# Patient Record
Sex: Female | Born: 1960 | Race: White | Hispanic: No | Marital: Married | Smoking: Former smoker
Health system: Southern US, Community
[De-identification: ages and names within clinical notes are randomized; demographics above are authoritative.]

## PROBLEM LIST (undated history)

## (undated) DIAGNOSIS — J45909 Unspecified asthma, uncomplicated: Secondary | ICD-10-CM

## (undated) DIAGNOSIS — T7840XA Allergy, unspecified, initial encounter: Secondary | ICD-10-CM

## (undated) DIAGNOSIS — M1990 Unspecified osteoarthritis, unspecified site: Secondary | ICD-10-CM

## (undated) DIAGNOSIS — E079 Disorder of thyroid, unspecified: Secondary | ICD-10-CM

## (undated) DIAGNOSIS — R011 Cardiac murmur, unspecified: Secondary | ICD-10-CM

## (undated) DIAGNOSIS — R7303 Prediabetes: Secondary | ICD-10-CM

## (undated) HISTORY — PX: APPENDECTOMY: SHX54

## (undated) HISTORY — DX: Prediabetes: R73.03

## (undated) HISTORY — DX: Cardiac murmur, unspecified: R01.1

## (undated) HISTORY — DX: Unspecified asthma, uncomplicated: J45.909

## (undated) HISTORY — DX: Allergy, unspecified, initial encounter: T78.40XA

## (undated) HISTORY — DX: Unspecified osteoarthritis, unspecified site: M19.90

## (undated) HISTORY — DX: Disorder of thyroid, unspecified: E07.9

---

## 2005-03-20 ENCOUNTER — Emergency Department: Payer: Self-pay | Admitting: Emergency Medicine

## 2009-09-24 ENCOUNTER — Ambulatory Visit: Payer: Self-pay | Admitting: Surgery

## 2009-09-24 ENCOUNTER — Ambulatory Visit: Payer: Self-pay | Admitting: Family Medicine

## 2009-09-26 LAB — PATHOLOGY REPORT

## 2009-10-04 ENCOUNTER — Emergency Department: Payer: Self-pay | Admitting: Emergency Medicine

## 2011-09-30 IMAGING — CT CT ABD-PELV W/ CM
1 of 2 series · 15 of 32 positions shown, 19 images · non-contrast
Comparison: none

REASON FOR EXAM: STAT CR [id number] READ BEFORE PT LEAVES THEN CALL ABD PAIN
RLQ EVAL FOR APPEND...
COMMENTS:

PROCEDURE:     CT  - CT ABDOMEN / PELVIS  W  - September 24, 2009  [DATE]
RESULT:     CT of the abdomen pelvis dated 09/19/2009.
TECHNIQUE: Helical 3 mm sections were obtained from the lung bases through
the pubic symphysis status post intravenous administration of 100 mL of
Ssovue-BBC and oral contrast.

[Series 2: appendicitis · axial · 0.68mm/px · z∈[-546,-138]mm · 15 of 148 slices shown, 19 images]
[im 6/148  soft-tissue]
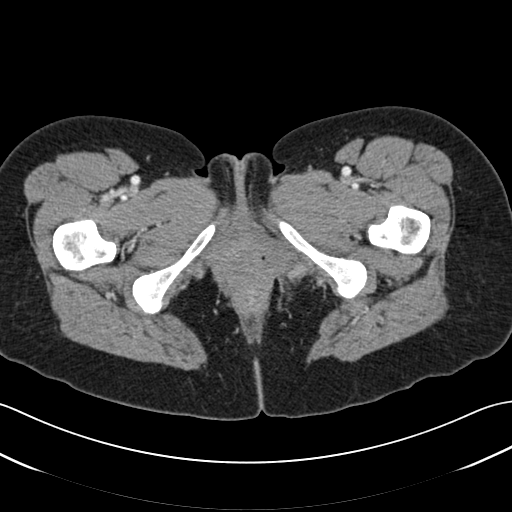
[im 6/148  bone]
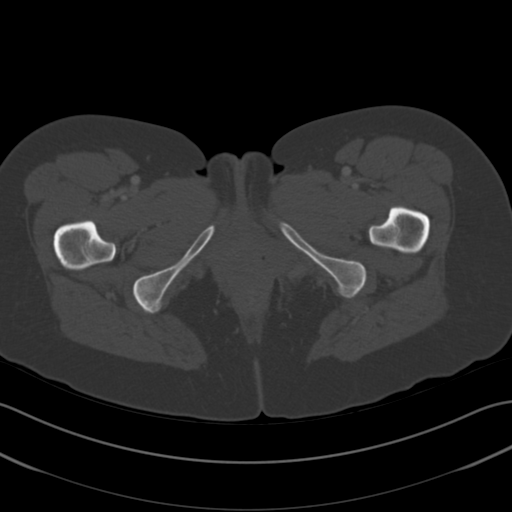
[im 18/148  soft-tissue]
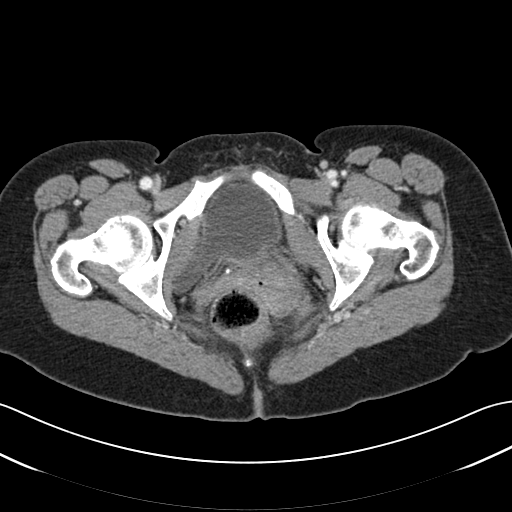
[im 30/148  soft-tissue]
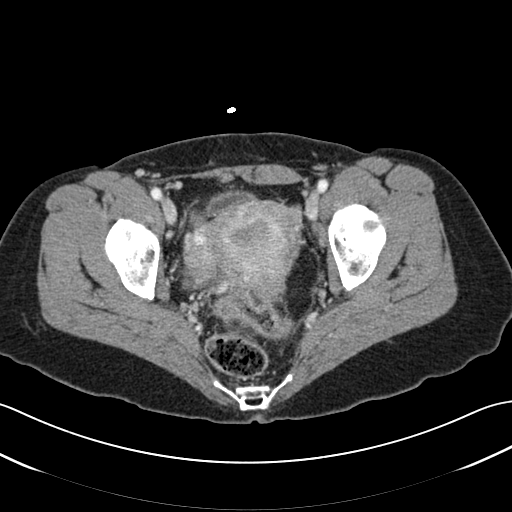
[im 42/148  soft-tissue]
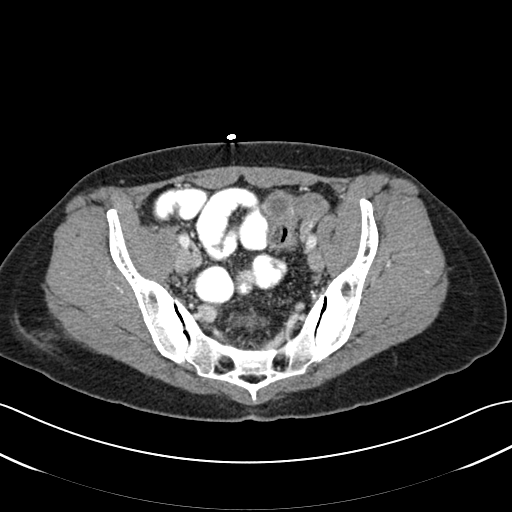
[im 53/148  soft-tissue]
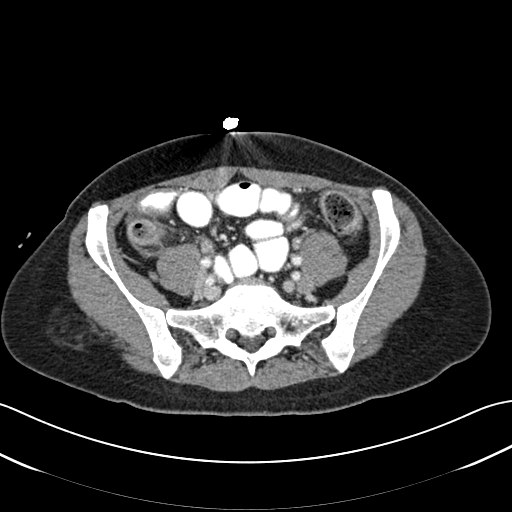
[im 65/148  soft-tissue]
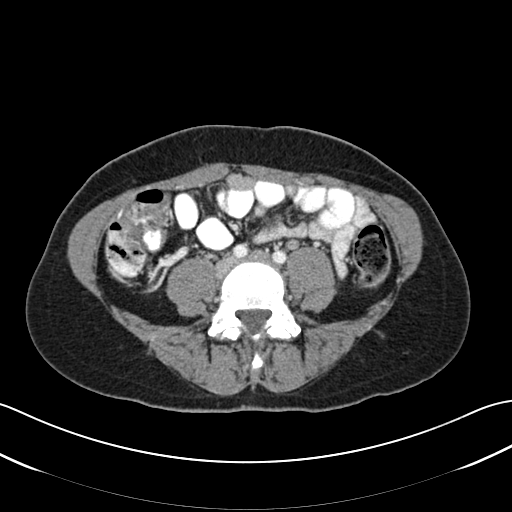
[im 77/148  soft-tissue]
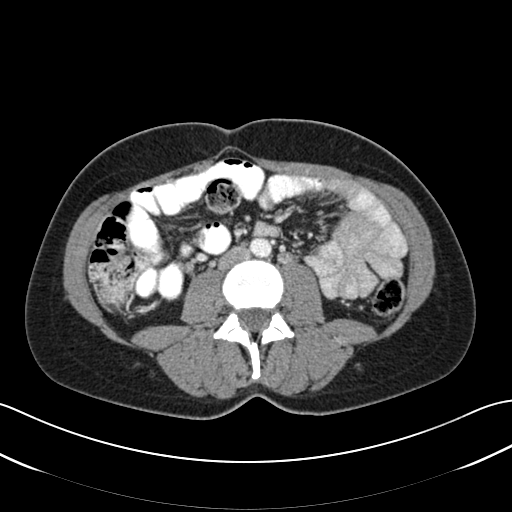
[im 83/148  soft-tissue]
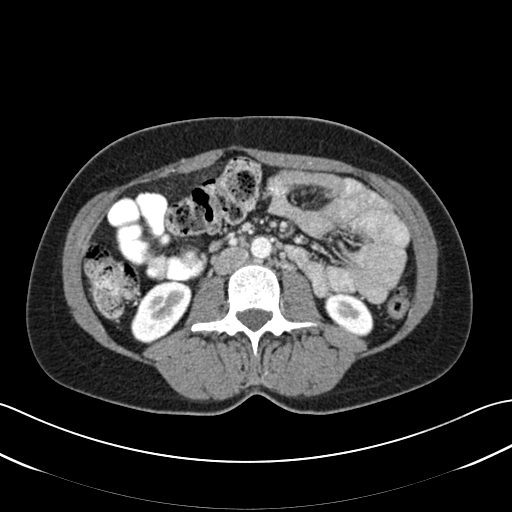
[im 95/148  soft-tissue]
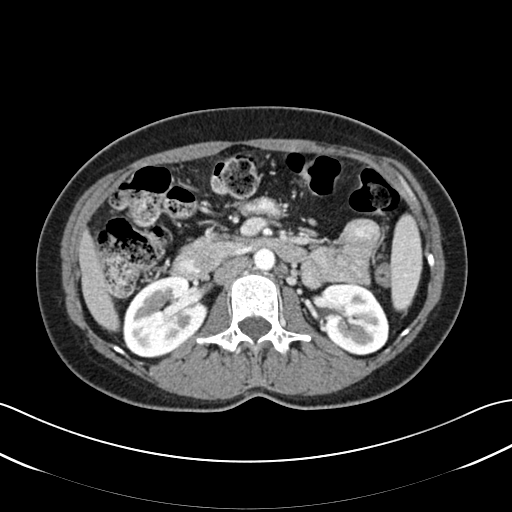
[im 95/148  bone]
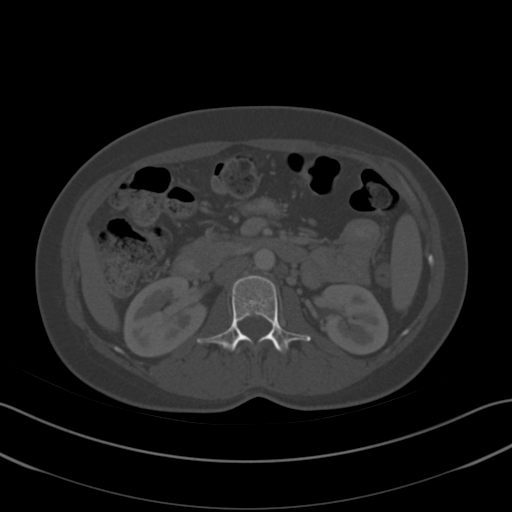
[im 106/148  soft-tissue]
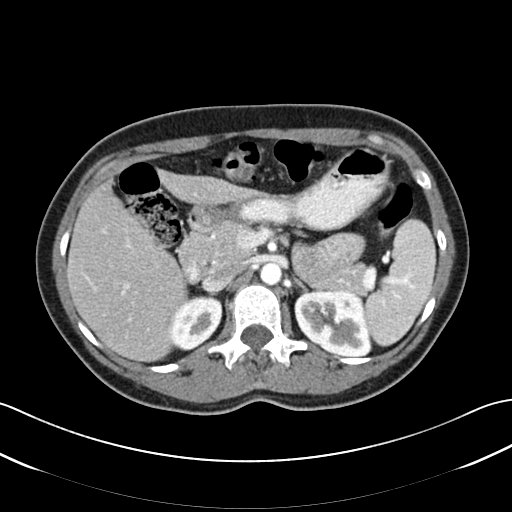
[im 118/148  soft-tissue]
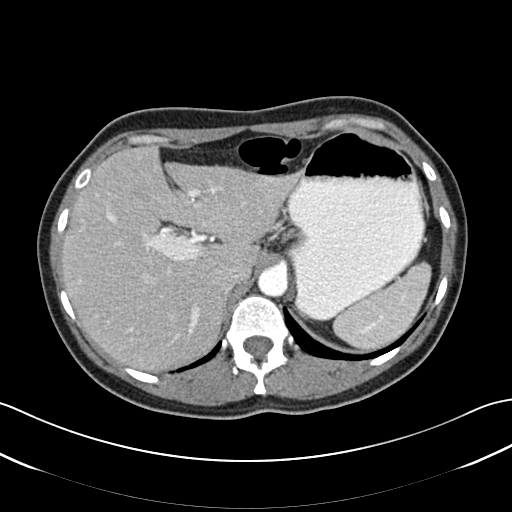
[im 124/148  lung]
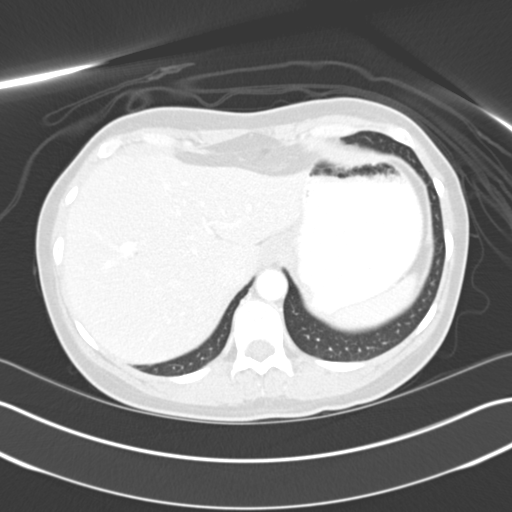
[im 130/148  soft-tissue]
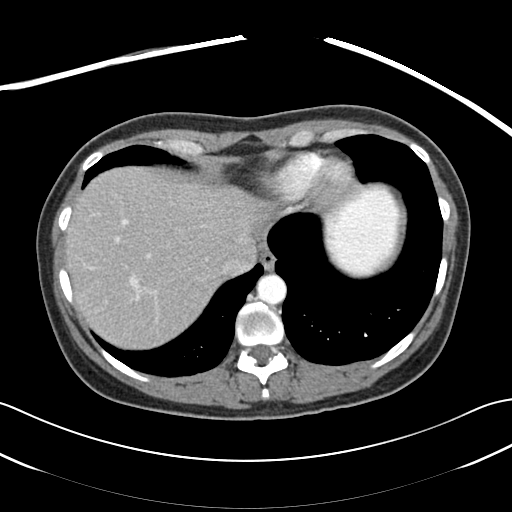
[im 130/148  lung]
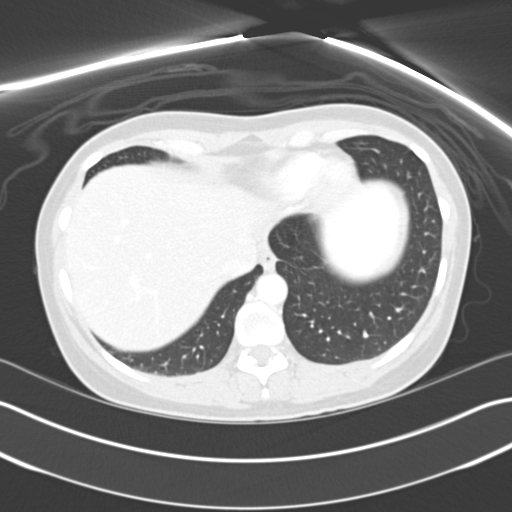
[im 136/148  lung]
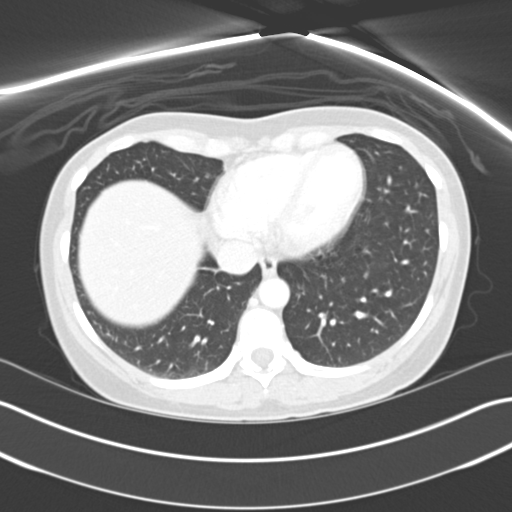
[im 142/148  soft-tissue]
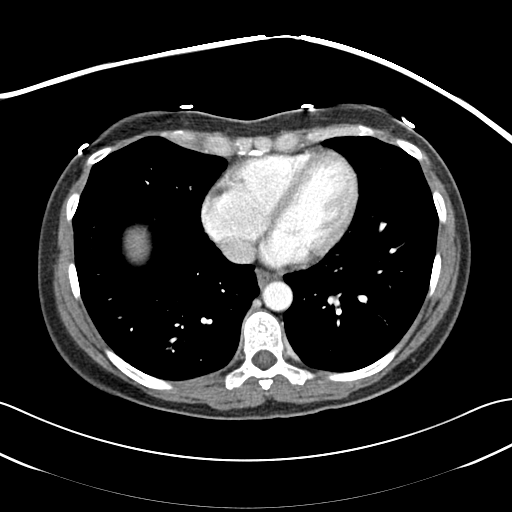
[im 142/148  lung]
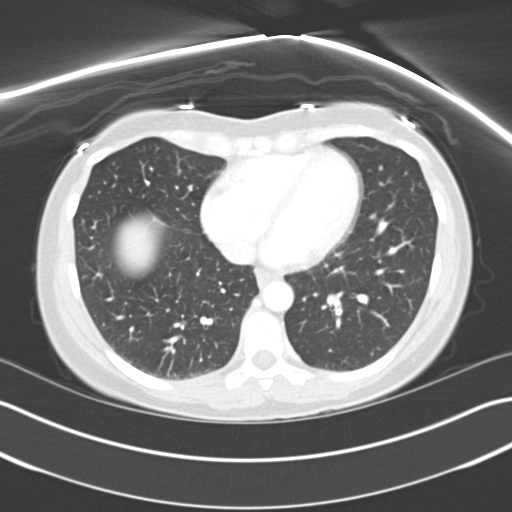

[15 of 32 positions shown; findings below may reference images not displayed]

FINDINGS: Evaluation of the lung bases demonstrates no gross abnormalities.

Evaluation of the right lower quadrant region demonstrates a dilated
appendix measuring 1.71 cm in diameter. The appendix is fluid and
air-filled. There is induration within the periappendiceal fat as well as a
small amount of surrounding free fluid. There is no evidence of drainable
loculated fluid collections. The appendix projects along the distal
posterior aspect of the cecum just anterior to the iliopsoas musculature.
Bowel wall thickening is identified within the distal portion of the cecum.
There is no evidence of associated free air. Small lymph nodes are
appreciated within the surrounding mesenteric fat.

The liver, spleen, adrenals, pancreas, kidneys are unremarkable. There is no
CT evidence of bowel obstruction. There no secondary signs suggesting
further regions of colitis, enteritis nor evidence of bowel obstruction.
There is no evidence of abdominal aortic aneurysm.

Evaluation of the pelvis demonstrates a focal low attenuating cystic
appearing region within the anterior adnexal region on the left measuring
2.68 cm in diameter like representing an ovarian cyst.
IMPRESSION: CT findings consistent with appendicitis as described
above. There is no evidence of an associated abscess. Etiologies such as
colitis with appendiceal extension, inflammatory versus infectious cannot be
completely excluded though are lower different consideration.

## 2011-11-08 DIAGNOSIS — G43909 Migraine, unspecified, not intractable, without status migrainosus: Secondary | ICD-10-CM | POA: Insufficient documentation

## 2013-06-15 ENCOUNTER — Ambulatory Visit: Payer: Self-pay | Admitting: Gastroenterology

## 2013-06-18 LAB — PATHOLOGY REPORT

## 2013-12-10 DIAGNOSIS — E039 Hypothyroidism, unspecified: Secondary | ICD-10-CM | POA: Insufficient documentation

## 2019-06-08 ENCOUNTER — Other Ambulatory Visit: Payer: Self-pay | Admitting: Family Medicine

## 2019-06-08 DIAGNOSIS — Z1231 Encounter for screening mammogram for malignant neoplasm of breast: Secondary | ICD-10-CM

## 2020-05-30 DIAGNOSIS — J309 Allergic rhinitis, unspecified: Secondary | ICD-10-CM | POA: Insufficient documentation

## 2020-07-28 ENCOUNTER — Encounter: Payer: Self-pay | Admitting: *Deleted

## 2020-07-28 ENCOUNTER — Emergency Department
Admission: EM | Admit: 2020-07-28 | Discharge: 2020-07-28 | Disposition: A | Discharge status: Home / Self Care | Payer: Self-pay | Attending: Emergency Medicine | Admitting: Emergency Medicine

## 2020-07-28 ENCOUNTER — Emergency Department: Payer: Self-pay

## 2020-07-28 ENCOUNTER — Other Ambulatory Visit: Payer: Self-pay

## 2020-07-28 DIAGNOSIS — M79674 Pain in right toe(s): Secondary | ICD-10-CM | POA: Insufficient documentation

## 2020-07-28 LAB — BASIC METABOLIC PANEL WITH GFR
Anion gap: 8 (ref 5–15)
BUN: 18 mg/dL (ref 6–20)
CO2: 27 mmol/L (ref 22–32)
Calcium: 9.2 mg/dL (ref 8.9–10.3)
Chloride: 105 mmol/L (ref 98–111)
Creatinine, Ser: 0.9 mg/dL (ref 0.44–1.00)
GFR, Estimated: >60 mL/min
Glucose, Bld: 150 mg/dL — ABNORMAL HIGH (ref 70–99)
Potassium: 3.6 mmol/L (ref 3.5–5.1)
Sodium: 140 mmol/L (ref 135–145)

## 2020-07-28 LAB — CBC WITH DIFFERENTIAL/PLATELET
Abs Immature Granulocytes: 0.01 K/uL (ref 0.00–0.07)
Basophils Absolute: 0 K/uL (ref 0.0–0.1)
Basophils Relative: 1 %
Eosinophils Absolute: 0.1 K/uL (ref 0.0–0.5)
Eosinophils Relative: 1 %
HCT: 37.4 % (ref 36.0–46.0)
Hemoglobin: 12.5 g/dL (ref 12.0–15.0)
Immature Granulocytes: 0 %
Lymphocytes Relative: 31 %
Lymphs Abs: 1.9 K/uL (ref 0.7–4.0)
MCH: 29.4 pg (ref 26.0–34.0)
MCHC: 33.4 g/dL (ref 30.0–36.0)
MCV: 88 fL (ref 80.0–100.0)
Monocytes Absolute: 0.5 K/uL (ref 0.1–1.0)
Monocytes Relative: 9 %
Neutro Abs: 3.6 K/uL (ref 1.7–7.7)
Neutrophils Relative %: 58 %
Platelets: 173 K/uL (ref 150–400)
RBC: 4.25 MIL/uL (ref 3.87–5.11)
RDW: 12.4 % (ref 11.5–15.5)
WBC: 6.1 K/uL (ref 4.0–10.5)
nRBC: 0 % (ref 0.0–0.2)

## 2020-07-28 LAB — URIC ACID: Uric Acid, Serum: 5.2 mg/dL (ref 2.5–7.1)

## 2020-07-28 MED ORDER — PREDNISONE 10 MG PO TABS: ORAL_TABLET | ORAL | 0 refills | Status: AC

## 2020-07-28 MED ORDER — PREDNISONE 20 MG PO TABS
60.0000 mg | ORAL_TABLET | Freq: Once | ORAL | Status: AC
  Administered 2020-07-28: 60 mg via ORAL
  Filled 2020-07-28: qty 3

## 2020-07-28 NOTE — ED Triage Notes (Signed)
Pt has swelling to right great toe.  Sx began last night.  No known injury to toe.  Pt was seen at urgent care but did not get meds filled.  Pt alert  Speech clear.

## 2020-07-28 NOTE — Discharge Instructions (Signed)
Please take steroid taper as directed on the packaging.  You may also use Tylenol, up to 1000 mg 4 times daily as needed for pain.  Please follow-up with orthopedics if your pain is not improving.

## 2020-07-29 NOTE — ED Provider Notes (Signed)
[facility] Emergency Department Provider Note  ____________________________________________   Event Date/Time   First MD Initiated Contact with Patient 07/28/20 2122     (approximate)  I have reviewed the triage vital signs and the nursing notes.   HISTORY  Chief Complaint Toe Pain   HPI Danielle Fuentes is a 60 y.o. female who presents to the emergency department for evaluation of right great toe swelling and pain.  She denies any known injury or history.  She denies any previous episodes of gout.  She states it began late last night/early this morning.  She was evaluated the urgent care and told to ice it and take NSAIDs, she did not do this because the pain was worsening and she was having difficulty ambulating.  She denies any recent sores, cuts to the area.  She denies any history of diabetes.       No past medical history on file.  There are no problems to display for this patient.    Prior to Admission medications   Medication Sig Start Date End Date Taking? Authorizing Provider  predniSONE (DELTASONE) 10 MG tablet Take 6 tablets (60 mg total) by mouth daily for 1 day, THEN 5 tablets (50 mg total) daily for 1 day, THEN 4 tablets (40 mg total) daily for 1 day, THEN 3 tablets (30 mg total) daily for 1 day, THEN 2 tablets (20 mg total) daily for 1 day, THEN 1 tablet (10 mg total) daily for 1 day. 07/28/20 08/03/20 Yes Lucy Chris, PA    Allergies Patient has no known allergies.  No family history on file.  Social History Social History   Tobacco Use  . Smoking status: Never Smoker  . Smokeless tobacco: Never Used  Substance Use Topics  . Alcohol use: Not Currently  . Drug use: Not Currently    Review of Systems Constitutional: No fever/chills Eyes: No visual changes. ENT: No sore throat. Cardiovascular: Denies chest pain. Respiratory: Denies shortness of breath. Gastrointestinal: No abdominal pain.  No nausea, no vomiting.  No  diarrhea.  No constipation. Genitourinary: Negative for dysuria. Musculoskeletal: + Right great toe pain, negative for back pain. Skin: Negative for rash. Neurological: Negative for headaches, focal weakness or numbness.   ____________________________________________   PHYSICAL EXAM:  VITAL SIGNS: ED Triage Vitals  Enc Vitals Group     BP 07/28/20 2108 (!) 145/79     Pulse Rate 07/28/20 2108 87     Resp 07/28/20 2108 18     Temp 07/28/20 2108 98.6 F (37 C)     Temp Source 07/28/20 2108 Oral     SpO2 07/28/20 2108 97 %     Weight 07/28/20 2105 138 lb (62.6 kg)     Height 07/28/20 2105 5\' 3"  (1.6 m)     Head Circumference --      Peak Flow --      Pain Score 07/28/20 2105 4     Pain Loc --      Pain Edu? --      Excl. in GC? --    Constitutional: Alert and oriented. Well appearing and in no acute distress. Eyes: Conjunctivae are normal. PERRL. EOMI. Head: Atraumatic. Nose: No congestion/rhinnorhea. Mouth/Throat: Mucous membranes are moist.  Oropharynx non-erythematous. Neck: No stridor.   Cardiovascular: Normal rate, regular rhythm. Grossly normal heart sounds.  Good peripheral circulation. Respiratory: Normal respiratory effort.  No retractions. Lungs CTAB. Musculoskeletal: There is soft tissue swelling noted around the first MTP of  the right foot, with mild erythema wrapping to the plantar surface of the first MTP.  Patient is able to wiggle all digits without significant difficulty.  Capillary refill less than 3 seconds.  No open wounds, cuts or sores.  No interdigital desquamation or fungi.  Dorsal pedal pulse 2+, ankle range of motion full. Neurologic:  Normal speech and language. No gross focal neurologic deficits are appreciated.  Skin:  Skin is warm, dry and intact. No rash noted. Psychiatric: Mood and affect are normal. Speech and behavior are normal.  ____________________________________________   LABS (all labs ordered are listed, but only abnormal results are  displayed)  Labs Reviewed  BASIC METABOLIC PANEL - Abnormal; Notable for the following components:      Result Value   Glucose, Bld 150 (*)    All other components within normal limits  CBC WITH DIFFERENTIAL/PLATELET  URIC ACID    ____________________________________________  RADIOLOGY Susa Raring, personally viewed and evaluated these images (plain radiographs) as part of my medical decision making, as well as reviewing the written report by the radiologist.  ED provider interpretation: Obvious degenerative changes of the first MTP without any significant acute findings  Official radiology report(s): DG Toe Great Right  Result Date: 07/28/2020 CLINICAL DATA:  First toe pain and swelling, no known injury, initial encounter EXAM: RIGHT GREAT TOE COMPARISON:  None. FINDINGS: Degenerative changes of the first MTP joint are seen. No acute fracture or dislocation is noted. Mild soft tissue swelling is noted. No other focal abnormality is seen. IMPRESSION: Degenerative change and soft tissue swelling. Electronically Signed   By: Alcide Clever M.D.   On: 07/28/2020 21:50   ____________________________________________   INITIAL IMPRESSION / ASSESSMENT AND PLAN / ED COURSE  As part of my medical decision making, I reviewed the following data within the electronic MEDICAL RECORD NUMBER Nursing notes reviewed and incorporated, Labs reviewed, Radiograph reviewed and Notes from prior ED visits        Patient is a 60 year old female without any significant past medical history who reports to the emergency department for evaluation of right great toe pain.  See HPI for further details.  In triage, patient is mildly hypertensive with otherwise normal vital signs.  Physical exam as above.  Differentials considered include first MTP sprain, gout, osteoarthritis, fracture, cellulitis  X-ray was obtained and demonstrates a fair amount of arthritic changes.  Labs were obtained and there is no  leukocytosis, uric acid is normal at 5.2.  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  Return precautions were discussed for any worsening of the erythema, fever, worsening of the pain.  In the interim, will initiate course of steroid for her obvious swelling and inflammation to the joint.  Patient is amenable with plan, stable this time for outpatient management.      ____________________________________________   FINAL CLINICAL IMPRESSION(S) / ED DIAGNOSES  Final diagnoses:  Great toe pain, right     ED Discharge Orders         Ordered    predniSONE (DELTASONE) 10 MG tablet        07/28/20 2311          *Please note:  ALBERTINA LEISE was evaluated in Emergency Department on 07/29/2020 for the symptoms described in the history of present illness. She was evaluated in the context of the global COVID-19 pandemic, which necessitated consideration that the patient  might be at risk for infection with the SARS-CoV-2 virus that causes COVID-19. Institutional protocols and algorithms that pertain to the evaluation of patients at risk for COVID-19 are in a state of rapid change based on information released by regulatory bodies including the CDC and federal and state organizations. These policies and algorithms were followed during the patient's care in the ED.  Some ED evaluations and interventions may be delayed as a result of limited staffing during and the pandemic.*   Note:  This document was prepared using Dragon voice recognition software and may include unintentional dictation errors.   Lucy Chris, PA 07/29/20 0014    Merwyn Katos, MD 07/29/20 [phone]

## 2020-09-20 ENCOUNTER — Emergency Department: Payer: Self-pay

## 2020-09-20 ENCOUNTER — Emergency Department
Admission: EM | Admit: 2020-09-20 | Discharge: 2020-09-20 | Disposition: A | Discharge status: Home / Self Care | Payer: Self-pay | Attending: Emergency Medicine | Admitting: Emergency Medicine

## 2020-09-20 ENCOUNTER — Other Ambulatory Visit: Payer: Self-pay

## 2020-09-20 DIAGNOSIS — K5901 Slow transit constipation: Secondary | ICD-10-CM | POA: Insufficient documentation

## 2020-09-20 LAB — CBC WITH DIFFERENTIAL/PLATELET
Abs Immature Granulocytes: 0.03 10*3/uL (ref 0.00–0.07)
Basophils Absolute: 0 10*3/uL (ref 0.0–0.1)
Basophils Relative: 0 %
Eosinophils Absolute: 0.1 10*3/uL (ref 0.0–0.5)
Eosinophils Relative: 2 %
HCT: 43.9 % (ref 36.0–46.0)
Hemoglobin: 14.5 g/dL (ref 12.0–15.0)
Immature Granulocytes: 1 %
Lymphocytes Relative: 41 %
Lymphs Abs: 2 10*3/uL (ref 0.7–4.0)
MCH: 29.4 pg (ref 26.0–34.0)
MCHC: 33 g/dL (ref 30.0–36.0)
MCV: 88.9 fL (ref 80.0–100.0)
Monocytes Absolute: 0.5 10*3/uL (ref 0.1–1.0)
Monocytes Relative: 10 %
Neutro Abs: 2.2 10*3/uL (ref 1.7–7.7)
Neutrophils Relative %: 46 %
Platelets: 191 10*3/uL (ref 150–400)
RBC: 4.94 MIL/uL (ref 3.87–5.11)
RDW: 12.2 % (ref 11.5–15.5)
WBC: 4.7 10*3/uL (ref 4.0–10.5)
nRBC: 0 % (ref 0.0–0.2)

## 2020-09-20 LAB — COMPREHENSIVE METABOLIC PANEL
ALT: 42 U/L (ref 0–44)
AST: 34 U/L (ref 15–41)
Albumin: 4.7 g/dL (ref 3.5–5.0)
Alkaline Phosphatase: 54 U/L (ref 38–126)
Anion gap: 7 (ref 5–15)
BUN: 15 mg/dL (ref 6–20)
CO2: 28 mmol/L (ref 22–32)
Calcium: 9.6 mg/dL (ref 8.9–10.3)
Chloride: 101 mmol/L (ref 98–111)
Creatinine, Ser: 0.79 mg/dL (ref 0.44–1.00)
GFR, Estimated: >60 mL/min (ref >60)
Glucose, Bld: 93 mg/dL (ref 70–99)
Potassium: 3.9 mmol/L (ref 3.5–5.1)
Sodium: 136 mmol/L (ref 135–145)
Total Bilirubin: 0.8 mg/dL (ref 0.3–1.2)
Total Protein: 8 g/dL (ref 6.5–8.1)

## 2020-09-20 MED ORDER — IOHEXOL 300 MG/ML  SOLN
75.0000 mL | Freq: Once | INTRAMUSCULAR | Status: AC | PRN
  Administered 2020-09-20: 75 mL via INTRAVENOUS
  Filled 2020-09-20: qty 75

## 2020-09-20 NOTE — ED Triage Notes (Signed)
Pt to ED POV for constipation. States has only had 2 BM in the past 2 weeks.  Has taken mag citrate

## 2020-09-20 NOTE — Discharge Instructions (Addendum)
Please use the mag citrate that was previously suggested to you, up to 200 mL daily.  You may alternatively use something similar to MiraLAX, which is 1 capful daily.  Follow-up with your primary care if you do not see improvement in your symptoms, otherwise follow-up with the ER if you experience any worsening of symptoms.

## 2020-09-20 NOTE — ED Notes (Signed)
Pt A&Ox4 ambulatory at d/c with independent steady gait, NAD. Pt verbalized understanding of d/c instructions and follow up care. °

## 2020-09-21 NOTE — ED Provider Notes (Signed)
[facility] Emergency Department Provider Note  ____________________________________________   Event Date/Time   First MD Initiated Contact with Patient 09/20/20 1855     (approximate)  I have reviewed the triage vital signs and the nursing notes.   HISTORY  Chief Complaint Constipation   HPI Danielle Fuentes is a 60 y.o. female who reports to the emergency department for evaluation of constipation.  Patient states that she has only had 2 very small bowel movements in the last 2 weeks.  She has tried laxatives with no improvement.  She was seen at an urgent care yesterday where x-rays were obtained.  They encouraged her to use 1 more dose of mag citrate to try and reduce a bowel movement if she did not have any success instructed her to return to the emergency department.  She now associates her constipation with generalized abdominal pain with nausea but denies any vomiting.  Denies any recent fever.         No past medical history on file.  There are no problems to display for this patient.    Prior to Admission medications   Not on File    Allergies Patient has no known allergies.  No family history on file.  Social History Social History   Tobacco Use   Smoking status: Never   Smokeless tobacco: Never  Substance Use Topics   Alcohol use: Not Currently   Drug use: Not Currently    Review of Systems Constitutional: No fever/chills Eyes: No visual changes. ENT: No sore throat. Cardiovascular: Denies chest pain. Respiratory: Denies shortness of breath. Gastrointestinal: + abdominal pain.  + nausea, no vomiting.  No diarrhea.  + constipation. Genitourinary: Negative for dysuria. Musculoskeletal: Negative for back pain. Skin: Negative for rash. Neurological: Negative for headaches, focal weakness or numbness.   ____________________________________________   PHYSICAL EXAM:  VITAL SIGNS: ED Triage Vitals  Enc Vitals Group     BP  09/20/20 1738 138/68     Pulse Rate 09/20/20 1738 75     Resp 09/20/20 1738 18     Temp 09/20/20 1738 98.9 F (37.2 C)     Temp Source 09/20/20 1738 Oral     SpO2 09/20/20 1738 100 %     Weight 09/20/20 1739 127 lb 13.9 oz (58 kg)     Height 09/20/20 1739 5\' 3"  (1.6 m)     Head Circumference --      Peak Flow --      Pain Score 09/20/20 1738 0     Pain Loc --      Pain Edu? --      Excl. in GC? --    Constitutional: Alert and oriented. Well appearing and in no acute distress. Eyes: Conjunctivae are normal. PERRL. EOMI. Head: Atraumatic. Nose: No congestion/rhinnorhea. Mouth/Throat: Mucous membranes are moist.  Oropharynx non-erythematous. Neck: No stridor.   Cardiovascular: Normal rate, regular rhythm. Grossly normal heart sounds.  Good peripheral circulation. Respiratory: Normal respiratory effort.  No retractions. Lungs CTAB. Gastrointestinal: Soft with generalized tenderness, particularly in the bilateral lower quadrants. No distention. No abdominal bruits. No CVA tenderness. Musculoskeletal: No lower extremity tenderness nor edema.  No joint effusions. Neurologic:  Normal speech and language. No gross focal neurologic deficits are appreciated. No gait instability. Skin:  Skin is warm, dry and intact. No rash noted. Psychiatric: Mood and affect are normal. Speech and behavior are normal.  ____________________________________________   LABS (all labs ordered are listed, but only abnormal results are  displayed)  Labs Reviewed  CBC WITH DIFFERENTIAL/PLATELET  COMPREHENSIVE METABOLIC PANEL    ____________________________________________  RADIOLOGY  Official radiology report(s): CT ABDOMEN PELVIS W CONTRAST  Result Date: 09/20/2020 CLINICAL DATA:  Bowel obstruction suspected Constipation. EXAM: CT ABDOMEN AND PELVIS WITH CONTRAST TECHNIQUE: Multidetector CT imaging of the abdomen and pelvis was performed using the standard protocol following bolus administration of  intravenous contrast. CONTRAST:  51mL OMNIPAQUE IOHEXOL 300 MG/ML  SOLN COMPARISON:  Remote abdominal CT 09/24/2009 FINDINGS: Lower chest: No acute airspace disease or pleural effusion. Heart is normal in size. Hepatobiliary: No focal liver abnormality is seen. No gallstones, gallbladder wall thickening, or biliary dilatation. Pancreas: No ductal dilatation or inflammation. Spleen: Normal in size without focal abnormality. Adrenals/Urinary Tract: Normal adrenal glands. No hydronephrosis or perinephric edema. Homogeneous renal enhancement with symmetric excretion on delayed phase imaging. No visualized renal stone or focal lesion. Urinary bladder is physiologically distended without wall thickening. Stomach/Bowel: Partially distended stomach. There is no small bowel obstruction or inflammation. Sutures at the base of the cecum suggestive of appendectomy. Moderate volume of stool throughout the colon. Mild diverticulosis of the distal descending and sigmoid colon. There is no colonic wall thickening or pericolonic inflammation. No evidence of pericolonic mass on this exam without enteric contrast. Vascular/Lymphatic: Normal caliber abdominal aorta. Patent portal vein and mesenteric vessels. No enlarged lymph nodes in the abdomen or pelvis. Reproductive: Uterus and bilateral adnexa are unremarkable. Other: No free air, free fluid, or intra-abdominal fluid collection. Tiny fat containing umbilical hernia. No inguinal hernia. Musculoskeletal: There are no acute or suspicious osseous abnormalities. IMPRESSION: 1. No bowel obstruction or acute abnormality in the abdomen/pelvis. 2. Moderate colonic stool burden, can be seen with constipation. No evidence of colonic stricture or mass. 3. Mild distal colonic diverticulosis without diverticulitis. Electronically Signed   By: Narda Rutherford M.D.   On: 09/20/2020 21:47      ____________________________________________   INITIAL IMPRESSION / ASSESSMENT AND PLAN / ED  COURSE  As part of my medical decision making, I reviewed the following data within the electronic MEDICAL RECORD NUMBER Nursing notes reviewed and incorporated, Labs reviewed, and Notes from prior ED visits        Patient is a 60 year old female who presents to the emergency department for evaluation of constipation that is been worsening, now with nausea present.  See HPI for further details.  In triage patient has normal vital signs.  Physical exam she has generalized tenderness in the abdomen, worse in the bilateral lower quadrants.  Otherwise grossly normal physical exam.  Review of the patient's chart from an outside urgent care reveals an x-ray that was obtained yesterday with findings concerning for ileus versus early SBO.  Given that the patient has had worsening of her symptoms no associated nausea, will obtain CT to rule out SBO.  Laboratory evaluation and CT is negative.  Discussed treatment with enema and the patient declines and does not want to do this.  Discussed other oral options including MiraLAX versus mag citrate versus Senokot in the advantages and disadvantages of each.  Patient would like to continue trialing these at home and follow-up with her primary care department and received enema today.  Patient is stable this time for outpatient follow-up.      ____________________________________________   FINAL CLINICAL IMPRESSION(S) / ED DIAGNOSES  Final diagnoses:  Slow transit constipation     ED Discharge Orders     None        Note:  This document  was prepared using Conservation officer, historic buildings and may include unintentional dictation errors.    Lucy Chris, PA 09/22/20 Glorianne Manchester, MD 09/23/20 1504

## 2022-02-18 DIAGNOSIS — R7303 Prediabetes: Secondary | ICD-10-CM | POA: Insufficient documentation

## 2022-09-26 IMAGING — CT CT ABD-PELV W/ CM
2 of 5 series · 15 of 46 positions shown, 17 images · IV contrast (APPLIED)
Comparison: Remote abdominal CT 09/24/2009

CLINICAL DATA: Bowel obstruction suspected

Constipation.
EXAM:
CT ABDOMEN AND PELVIS WITH CONTRAST
TECHNIQUE: Multidetector CT imaging of the abdomen and pelvis was performed
using the standard protocol following bolus administration of
intravenous contrast.
CONTRAST:  75mL OMNIPAQUE IOHEXOL 300 MG/ML  SOLN

[Series 2: routine abd/pel with · axial · 0.77mm/px · z∈[-654,-234]mm · 12 of 94 slices shown, 14 images]
[im 5/94  soft-tissue]
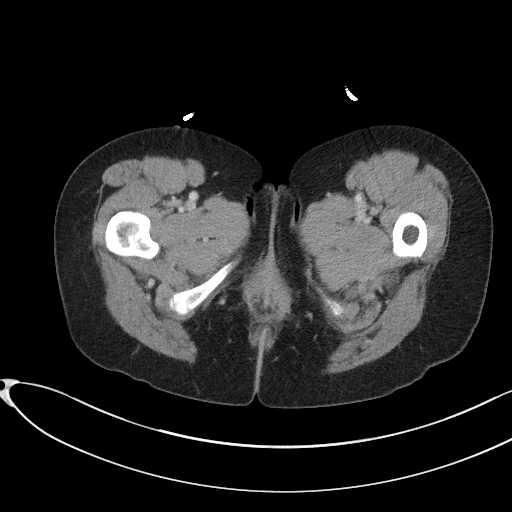
[im 5/94  bone]
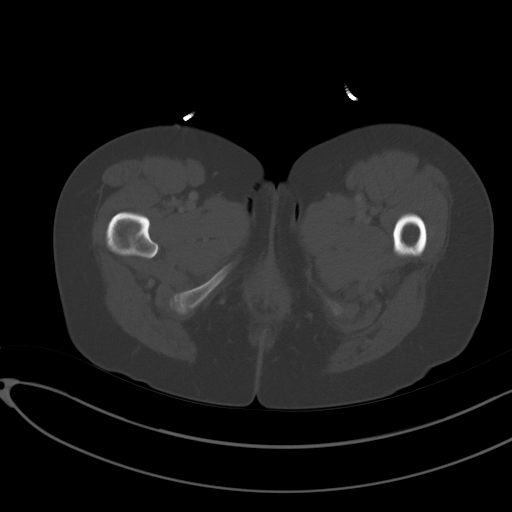
[im 15/94  soft-tissue]
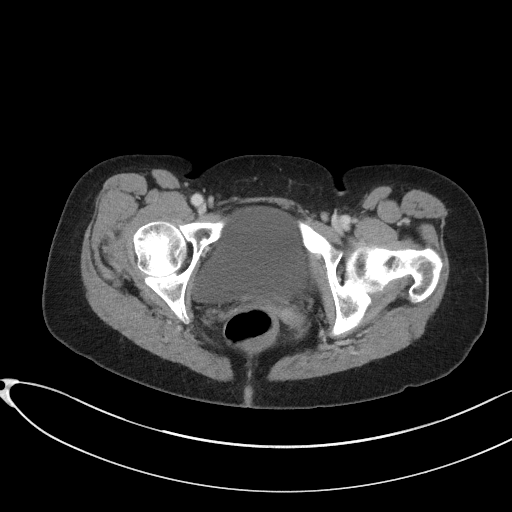
[im 20/94  soft-tissue]
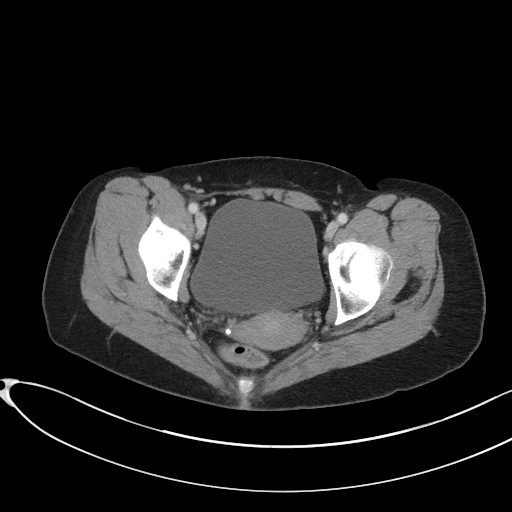
[im 30/94  soft-tissue]
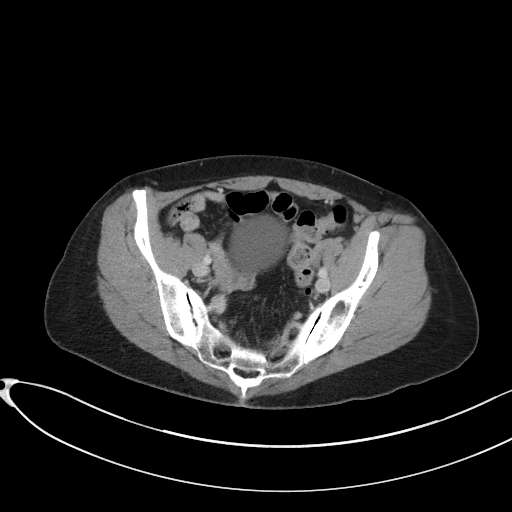
[im 35/94  soft-tissue]
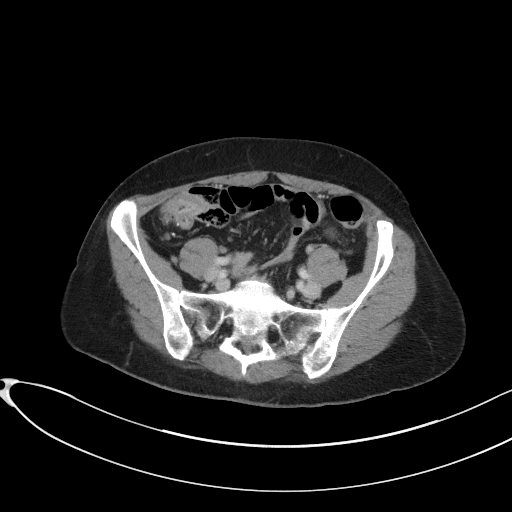
[im 45/94  soft-tissue]
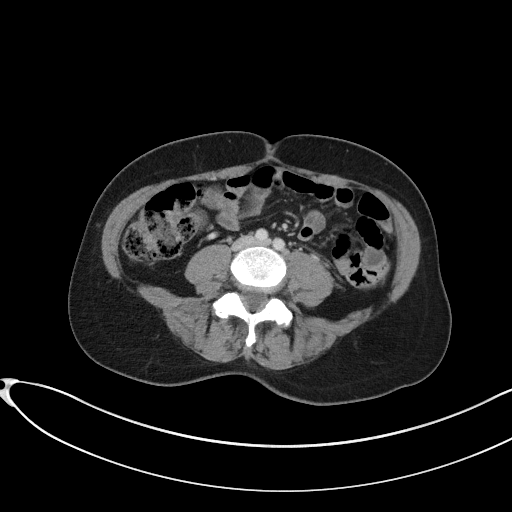
[im 49/94  soft-tissue]
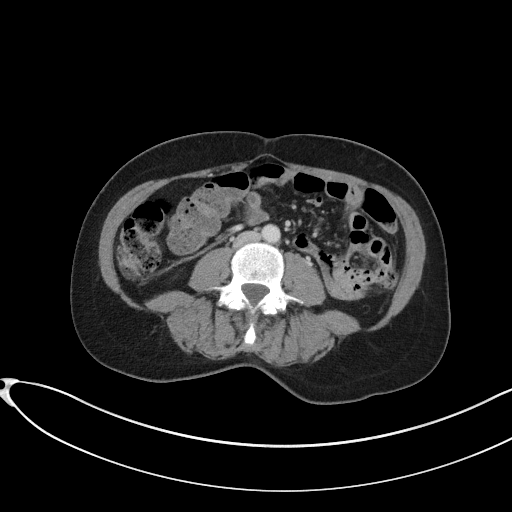
[im 59/94  soft-tissue]
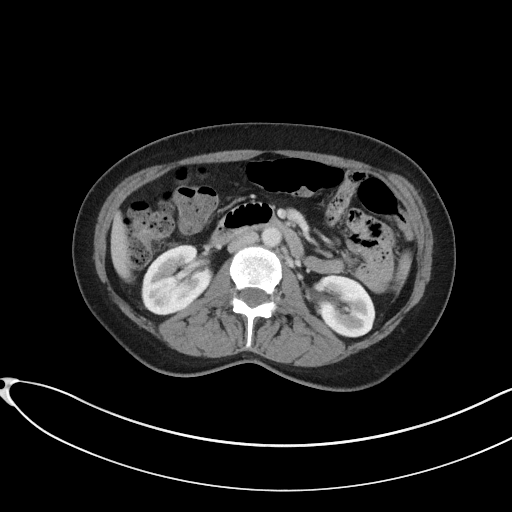
[im 64/94  soft-tissue]
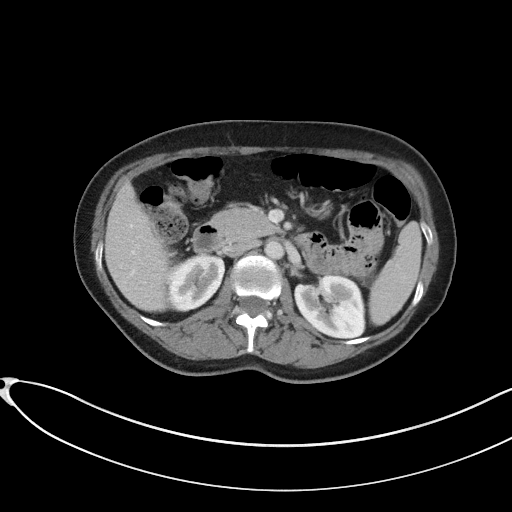
[im 64/94  bone]
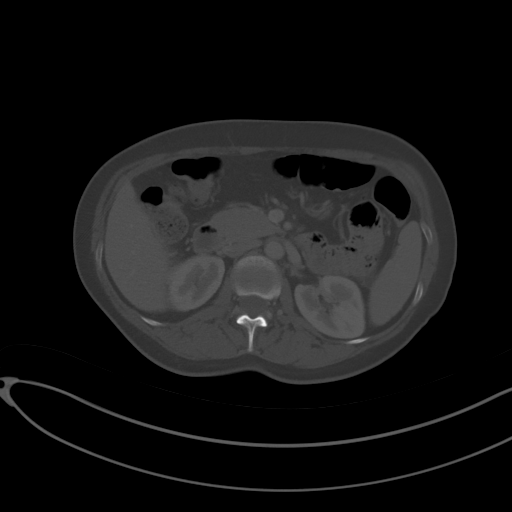
[im 74/94  soft-tissue]
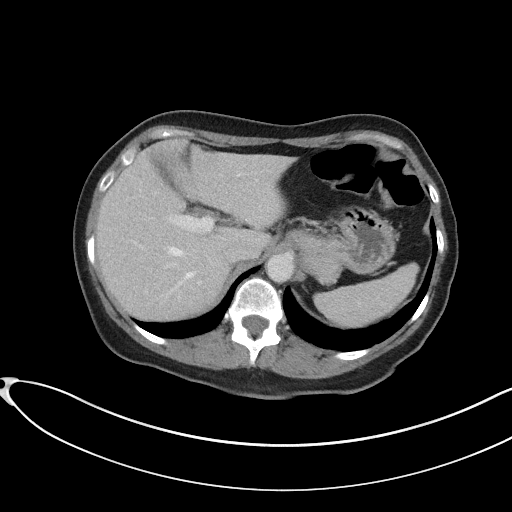
[im 79/94  soft-tissue]
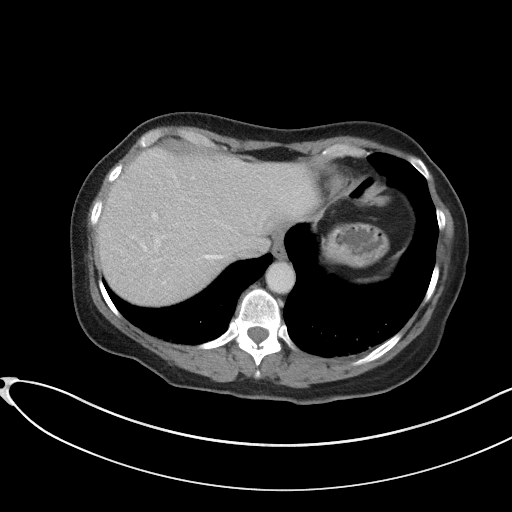
[im 89/94  soft-tissue]
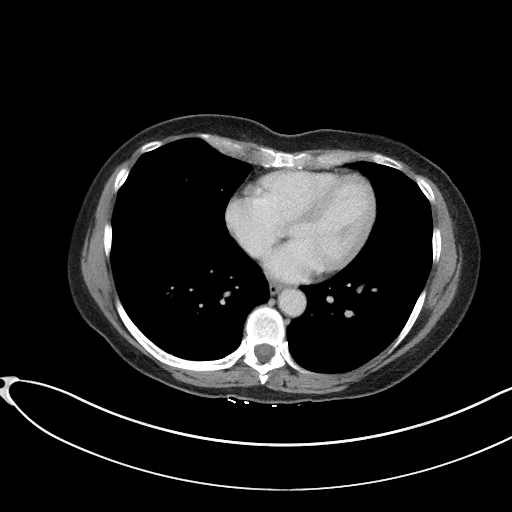

[Series 5: coronal st · coronal · 0.70mm/px · 3 of 82 slices shown]
[im 28/82  soft-tissue]
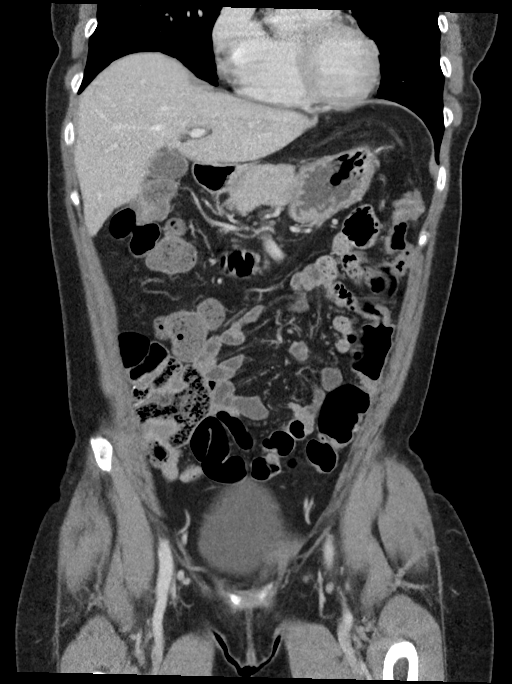
[im 37/82  soft-tissue]
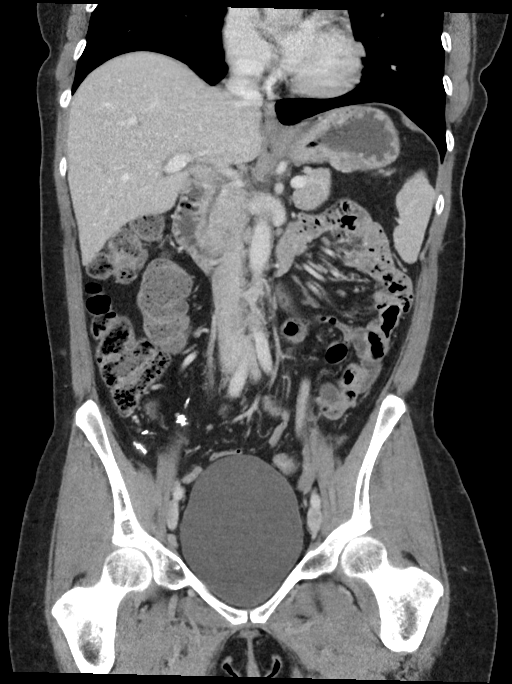
[im 46/82  soft-tissue]
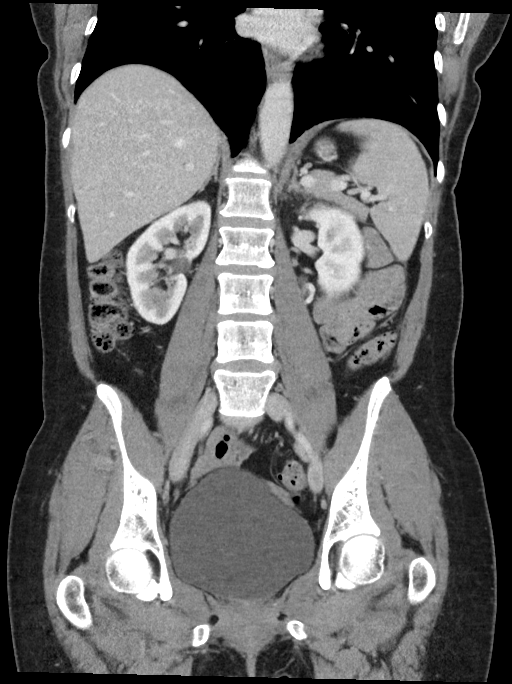

[15 of 46 positions shown; findings below may reference images not displayed]

FINDINGS: Lower chest: No acute airspace disease or pleural effusion. Heart is
normal in size.

Hepatobiliary: No focal liver abnormality is seen. No gallstones,
gallbladder wall thickening, or biliary dilatation.

Pancreas: No ductal dilatation or inflammation.

Spleen: Normal in size without focal abnormality.

Adrenals/Urinary Tract: Normal adrenal glands. No hydronephrosis or
perinephric edema. Homogeneous renal enhancement with symmetric
excretion on delayed phase imaging. No visualized renal stone or
focal lesion. Urinary bladder is physiologically distended without
wall thickening.

Stomach/Bowel: Partially distended stomach. There is no small bowel
obstruction or inflammation. Sutures at the base of the cecum
suggestive of appendectomy. Moderate volume of stool throughout the
colon. Mild diverticulosis of the distal descending and sigmoid
colon. There is no colonic wall thickening or pericolonic
inflammation. No evidence of pericolonic mass on this exam without
enteric contrast.

Vascular/Lymphatic: Normal caliber abdominal aorta. Patent portal
vein and mesenteric vessels. No enlarged lymph nodes in the abdomen
or pelvis.

Reproductive: Uterus and bilateral adnexa are unremarkable.

Other: No free air, free fluid, or intra-abdominal fluid collection.
Tiny fat containing umbilical hernia. No inguinal hernia.

Musculoskeletal: There are no acute or suspicious osseous
abnormalities.
IMPRESSION: 1. No bowel obstruction or acute abnormality in the abdomen/pelvis.
2. Moderate colonic stool burden, can be seen with constipation. No
evidence of colonic stricture or mass.
3. Mild distal colonic diverticulosis without diverticulitis.

## 2022-10-02 DIAGNOSIS — K5909 Other constipation: Secondary | ICD-10-CM | POA: Insufficient documentation

## 2022-10-02 DIAGNOSIS — K30 Functional dyspepsia: Secondary | ICD-10-CM | POA: Insufficient documentation

## 2022-10-02 NOTE — Patient Instructions (Addendum)
Migraine Headache A migraine headache is a very strong throbbing pain on one or both sides of your head. This type of headache can also cause other symptoms. It can last from 4 hours to 3 days. Talk with your doctor about what things may bring on (trigger) this condition. What are the causes? The exact cause of a migraine is not known. This condition may be brought on or caused by: Smoking. Medicines, such as: Medicine used to treat chest pain (nitroglycerin). Birth control pills. Estrogen. Some blood pressure medicines. Certain substances in some foods or drinks. Foods and drinks, such as: Cheese. Chocolate. Alcohol. Caffeine. Doing physical activity that is very hard. Other things that may trigger a migraine headache include: Periods. Pregnancy. Hunger. Stress. Getting too much or too little sleep. Weather changes. Feeling tired (fatigue). What increases the risk? Being 25-55 years old. Being female. Having a family history of migraine headaches. Being Caucasian. Having a mental health condition, such as being sad (depressed) or feeling worried or nervous (anxious). Being very overweight (obese). What are the signs or symptoms? A throbbing pain. This pain may: Happen in any area of the head, such as on one or both sides. Make it hard to do daily activities. Get worse with physical activity. Get worse around bright lights, loud noises, or smells. Other symptoms may include: Feeling like you may vomit (nauseous). Vomiting. Dizziness. Before a migraine headache starts, you may get warning signs (an aura). An aura may include: Seeing flashing lights or having blind spots. Seeing bright spots, halos, or zigzag lines. Having tunnel vision or blurred vision. Having numbness or a tingling feeling. Having trouble talking. Having weak muscles. After a migraine ends, you may have symptoms. These may include: Tiredness. Trouble thinking (concentrating). How is this  treated? Taking medicines that: Relieve pain. Relieve the feeling like you may vomit. Prevent migraine headaches. Treatment may also include: Acupuncture. Lifestyle changes like avoiding foods that bring on migraine headaches. Learning ways to control your body functions (biofeedback). Therapy to help you know and deal with negative thoughts (cognitive behavioral therapy). Follow these instructions at home: Medicines Take over-the-counter and prescription medicines only as told by your doctor. If told, take steps to prevent problems with pooping (constipation). You may need to: Drink enough fluid to keep your pee (urine) pale yellow. Take medicines. You will be told what medicines to take. Eat foods that are high in fiber. These include beans, whole grains, and fresh fruits and vegetables. Limit foods that are high in fat and sugar. These include fried or sweet foods. Ask your doctor if you should avoid driving or using machines while you are taking your medicine. Lifestyle  Do not drink alcohol. Do not smoke or use any products that contain nicotine or tobacco. If you need help quitting, ask your doctor. Get 7-9 hours of sleep each night, or the amount recommended by your doctor. Find ways to deal with stress, such as meditation, deep breathing, or yoga. Try to exercise often. This can help lessen how bad and how often your migraines happen. General instructions Keep a journal to find out what may bring on your migraine headaches. This can help you avoid those things. For example, write down: What you eat and drink. How much sleep you get. Any change to your medicines or diet. If you have a migraine headache: Avoid things that make your symptoms worse, such as bright lights. Lie down in a dark, quiet room. Do not drive or use machinery. Ask your   doctor what activities are safe for you. Where to find more information Coalition for Headache and Migraine Patients (CHAMP):  headachemigraine.org American Migraine Foundation: americanmigrainefoundation.org National Headache Foundation: headaches.org Contact a doctor if: You get a migraine headache that is different or worse than others you have had. You have more than 15 days of headaches in one month. Get help right away if: Your migraine headache gets very bad. Your migraine headache lasts more than 72 hours. You have a fever or stiff neck. You have trouble seeing. Your muscles feel weak or like you cannot control them. You lose your balance a lot. You have trouble walking. You faint. You have a seizure. This information is not intended to replace advice given to you by your health care provider. Make sure you discuss any questions you have with your health care provider. Document Revised: 11/02/2021 Document Reviewed: 11/02/2021 Elsevier Patient Education  2024 Elsevier Inc.  

## 2022-10-05 ENCOUNTER — Encounter: Payer: Self-pay | Admitting: Nurse Practitioner

## 2022-10-05 ENCOUNTER — Other Ambulatory Visit (HOSPITAL_COMMUNITY)
Admission: RE | Admit: 2022-10-05 | Discharge: 2022-10-05 | Disposition: A | Discharge status: Home / Self Care | Payer: Managed Care, Other (non HMO) | Source: Ambulatory Visit | Attending: Nurse Practitioner | Admitting: Nurse Practitioner

## 2022-10-05 ENCOUNTER — Ambulatory Visit (INDEPENDENT_AMBULATORY_CARE_PROVIDER_SITE_OTHER): Payer: Managed Care, Other (non HMO) | Admitting: Nurse Practitioner

## 2022-10-05 VITALS — BP 127/83 | HR 67 | Temp 98.3°F | Ht 62.01 in | Wt 132.4 lb

## 2022-10-05 DIAGNOSIS — Z124 Encounter for screening for malignant neoplasm of cervix: Secondary | ICD-10-CM | POA: Insufficient documentation

## 2022-10-05 DIAGNOSIS — E039 Hypothyroidism, unspecified: Secondary | ICD-10-CM

## 2022-10-05 DIAGNOSIS — Z1231 Encounter for screening mammogram for malignant neoplasm of breast: Secondary | ICD-10-CM

## 2022-10-05 DIAGNOSIS — Z7689 Persons encountering health services in other specified circumstances: Secondary | ICD-10-CM

## 2022-10-05 DIAGNOSIS — R7303 Prediabetes: Secondary | ICD-10-CM | POA: Diagnosis not present

## 2022-10-05 DIAGNOSIS — R102 Pelvic and perineal pain: Secondary | ICD-10-CM

## 2022-10-05 DIAGNOSIS — G43109 Migraine with aura, not intractable, without status migrainosus: Secondary | ICD-10-CM

## 2022-10-05 NOTE — Assessment & Plan Note (Signed)
?  mild prolapse present.  Obtained pap today as is past due and will order ultrasound to further assess.  If any abnormalities will plan on sending to GYN.  Reports he mother has a history of prolapse and had to have hysterectomy around same age.  Discussed plan with patient.

## 2022-10-05 NOTE — Progress Notes (Signed)
New Patient Office Visit  Subjective    Patient ID: Danielle Fuentes, female    DOB: 02-Feb-1961  Age: 62 y.o. MRN: [id number]  CC:  Chief Complaint  Patient presents with   New Patient (Initial Visit)    To establish care.  Patient states that during sex it hurts.    HPI Danielle Fuentes presents for new patient visit to establish care.  Introduced to Publishing rights manager role and practice setting.  All questions answered.  Discussed provider/patient relationship and expectations.  Has not had insurance for years -- was following with [facility].  HYPOTHYROIDISM Last TSH check March 2024 was stable.  Currently taking Levothyroxine 125 MCG daily. Thyroid control status:stable Satisfied with current treatment? yes Medication side effects: no Medication compliance: good compliance Etiology of hypothyroidism: unknown Recent dose adjustment:no Fatigue: yes -- currently over worked Cold intolerance: yes at baseline Heat intolerance: no Weight gain: no Weight loss: no Constipation: yes at baseline Diarrhea/loose stools: no Palpitations: no Lower extremity edema: no Anxiety/depressed mood: no   PREDIABETES Mom, dad, and brother all have diabetes. Last A1c 5.8% in March. Working on diet change, is doing strength exercises. Polydipsia/polyuria: no Visual disturbance: no Chest pain: no Paresthesias: no  MIGRAINES These are reducing -- believes was hormonal, last cycle about 10 years ago and migraines have reduced since then.  Currently one a month or every couple months. Duration: chronic Quality: dull, aching, and throbbing Frequency: intermittent Location: over right eye Headache duration: 3 days Radiation: no Time of day headache occurs: varies Alleviating factors: Maxalt Aggravating factors: stress and atmosphere changes Headache status at time of visit: asymptomatic Treatments attempted: rest and triptans   Aura: yes Nausea:  yes Vomiting: no Photophobia:   no Phonophobia:  no Effect on social functioning:  no Numbers of missed days of school/work each month: 0 Confusion:  no Gait disturbance/ataxia:  no Behavioral changes:  no Fevers:  no   PAINFUL INTERCOURSE Has been present since December 2023, feels like something is being hit with penetration -- even without intercourse feels like something is there.  No vaginal dryness.  Does not have pain with foreplay.  Not noted at beginning or penetration, but with deep penetration.  Had episiotomy with one child.  G 2 P 2.  No menstrual cycle in 10 years.  No recent pap. Duration: months Pruritus: no Dysuria: no Malodorous: no Urinary frequency: no Fevers: no Abdominal pain: yes -- has been followed by GI for this and went through EGD Sexual activity: monogamous History of sexually transmitted diseases: no   Outpatient Encounter Medications as of 10/05/2022  Medication Sig   levothyroxine (SYNTHROID) 125 MCG tablet Take 125 mcg by mouth daily before breakfast.   loratadine (CLARITIN) 10 MG tablet Take 10 mg by mouth daily.   rizatriptan (MAXALT-MLT) 10 MG disintegrating tablet Take 10 mg by mouth daily as needed for migraine. May repeat after 2 hours with maximum of 3 doses in 24 hours and 12 dose per month.   [DISCONTINUED] metFORMIN (GLUCOPHAGE-XR) 750 MG 24 hr tablet Take 750 mg by mouth daily with breakfast.   No facility-administered encounter medications on file as of 10/05/2022.    Past Medical History:  Diagnosis Date   Arthritis    Asthma    Prediabetes    Thyroid disease     Past Surgical History:  Procedure Laterality Date   APPENDECTOMY      Family History  Problem Relation Age of Onset   Diabetes  Mother    Hyperlipidemia Mother    Heart disease Father    Diabetes Father    Kidney failure Father    Diabetes Brother    Diabetes Maternal Grandfather    Heart disease Paternal Grandmother     Social History   Socioeconomic History   Marital status: Married     Spouse name: Not on file   Number of children: Not on file   Years of education: Not on file   Highest education level: Not on file  Occupational History   Not on file  Tobacco Use   Smoking status: Former    Current packs/day: 0.00    Types: Cigarettes    Quit date: 2002    Years since quitting: 22.5   Smokeless tobacco: Never  Substance and Sexual Activity   Alcohol use: Not Currently   Drug use: Never   Sexual activity: Yes    Birth control/protection: None  Other Topics Concern   Not on file  Social History Narrative   Not on file   Social Determinants of Health   Financial Resource Strain: Low Risk  (10/05/2022)   Overall Financial Resource Strain (CARDIA)    Difficulty of Paying Living Expenses: Not hard at all  Food Insecurity: No Food Insecurity (10/05/2022)   Hunger Vital Sign    Worried About Running Out of Food in the Last Year: Never true    Ran Out of Food in the Last Year: Never true  Transportation Needs: No Transportation Needs (10/05/2022)   PRAPARE - Administrator, Civil Service (Medical): No    Lack of Transportation (Non-Medical): No  Physical Activity: Insufficiently Active (10/05/2022)   Exercise Vital Sign    Days of Exercise per Week: 3 days    Minutes of Exercise per Session: 30 min  Stress: Stress Concern Present (10/05/2022)   [employer] of Occupational Health - Occupational Stress Questionnaire    Feeling of Stress : Rather much  Social Connections: Moderately Integrated (10/05/2022)   Social Connection and Isolation Panel [NHANES]    Frequency of Communication with Friends and Family: More than three times a week    Frequency of Social Gatherings with Friends and Family: More than three times a week    Attends Religious Services: More than 4 times per year    Active Member of [employer] or Organizations: No    Attends Banker Meetings: Never    Marital Status: Married  Catering manager Violence: Not At Risk  (10/05/2022)   Humiliation, Afraid, Rape, and Kick questionnaire    Fear of Current or Ex-Partner: No    Emotionally Abused: No    Physically Abused: No    Sexually Abused: No    Review of Systems  Constitutional:  Negative for chills, diaphoresis, fever and weight loss.  Respiratory:  Negative for cough, shortness of breath and wheezing.   Cardiovascular:  Negative for chest pain, palpitations, orthopnea and leg swelling.  Gastrointestinal: Negative.   Neurological: Negative.   Endo/Heme/Allergies: Negative.   Psychiatric/Behavioral: Negative.        Objective    BP 127/83   Pulse 67   Temp 98.3 F (36.8 C) (Oral)   Ht 5' 2.01" (1.575 m)   Wt 132 lb 6.4 oz (60.1 kg)   SpO2 98%   BMI 24.21 kg/m   Physical Exam Vitals and nursing note reviewed. Exam conducted with a chaperone present.  Constitutional:      General: She is awake. She is  not in acute distress.    Appearance: She is well-developed and well-groomed. She is not ill-appearing or toxic-appearing.  HENT:     Head: Normocephalic.     Right Ear: Hearing and external ear normal.     Left Ear: Hearing and external ear normal.  Eyes:     General: Lids are normal.        Right eye: No discharge.        Left eye: No discharge.     Conjunctiva/sclera: Conjunctivae normal.     Pupils: Pupils are equal, round, and reactive to light.  Neck:     Thyroid: No thyromegaly.     Vascular: No carotid bruit.  Cardiovascular:     Rate and Rhythm: Normal rate and regular rhythm.     Heart sounds: Normal heart sounds. No murmur heard.    No gallop.  Pulmonary:     Effort: Pulmonary effort is normal. No accessory muscle usage or respiratory distress.     Breath sounds: Normal breath sounds. No decreased breath sounds, wheezing or rhonchi.  Abdominal:     General: Bowel sounds are normal. There is no distension.     Palpations: Abdomen is soft.     Tenderness: There is no abdominal tenderness.     Hernia: There is no hernia  in the left inguinal area or right inguinal area.  Genitourinary:    Exam position: Lithotomy position.     Labia:        Right: No rash.        Left: No rash.      Urethra: No prolapse.     Vagina: Erythema (mild noted) present.     Cervix: No cervical motion tenderness, discharge, friability, lesion or erythema.     Uterus: With uterine prolapse (possible mild noted).      Adnexa: Right adnexa normal and left adnexa normal.     Comments: Possible mild uterine prolapse noted.  Cervix anterior and to 2 o'clock position.  Tender on internal exam. Musculoskeletal:     Cervical back: Normal range of motion and neck supple.     Right lower leg: No edema.     Left lower leg: No edema.  Lymphadenopathy:     Cervical: No cervical adenopathy.  Skin:    General: Skin is warm and dry.  Neurological:     Mental Status: She is alert and oriented to person, place, and time.     Deep Tendon Reflexes: Reflexes are normal and symmetric.     Reflex Scores:      Brachioradialis reflexes are 2+ on the right side and 2+ on the left side.      Patellar reflexes are 2+ on the right side and 2+ on the left side. Psychiatric:        Attention and Perception: Attention normal.        Mood and Affect: Mood normal.        Speech: Speech normal.        Behavior: Behavior normal. Behavior is cooperative.        Thought Content: Thought content normal.       Assessment & Plan:   Problem List Items Addressed This Visit       Cardiovascular and Mediastinum   Migraine - Primary    Chronic, stable.  1 migraine every one to two months, improving with hormone changes.  Continue Maxalt PRN and if any worsening discussed with her starting preventative, which she has never taken.  Could consider Amitriptyline or Topamax.  Would avoid BB due to lower resting HR at baseline.      Relevant Medications   rizatriptan (MAXALT-MLT) 10 MG disintegrating tablet     Endocrine   Hypothyroidism, acquired    Chronic,  stable.  Recent labs in March within normal ranges.  Continue current dose of Levothyroxine and adjust as needed.  Plan on thyroid labs annually, unless symptoms present.      Relevant Medications   levothyroxine (SYNTHROID) 125 MCG tablet     Other   Prediabetes    Labs in March 2023 noted A1c 5.8%.  Significant family history of diabetes.  Monitor closely.  She is heavily focused on diet and exercise.  Recheck in the October or November.      Vaginal pain    ?mild prolapse present.  Obtained pap today as is past due and will order ultrasound to further assess.  If any abnormalities will plan on sending to GYN.  Reports he mother has a history of prolapse and had to have hysterectomy around same age.  Discussed plan with patient.      Relevant Orders   US Pelvic Complete With Transvaginal   Other Visit Diagnoses     Encounter for screening mammogram for malignant neoplasm of breast       Mammogram ordered today and instructed on how to schedule.   Relevant Orders   MM 3D SCREENING MAMMOGRAM BILATERAL BREAST   Cervical cancer screening       Pap performed today and sent to lab.   Relevant Orders   Cytology - PAP   Encounter to establish care       New patient to clinic, introduced to clinic and provider.       Return in about 4 weeks (around 11/02/2022) for Pain with intercourse.   Marjie Skiff, NP

## 2022-10-05 NOTE — Assessment & Plan Note (Signed)
Chronic, stable.  Recent labs in March within normal ranges.  Continue current dose of Levothyroxine and adjust as needed.  Plan on thyroid labs annually, unless symptoms present.

## 2022-10-05 NOTE — Assessment & Plan Note (Signed)
Labs in March 2023 noted A1c 5.8%.  Significant family history of diabetes.  Monitor closely.  She is heavily focused on diet and exercise.  Recheck in the October or November.

## 2022-10-05 NOTE — Assessment & Plan Note (Signed)
Chronic, stable.  1 migraine every one to two months, improving with hormone changes.  Continue Maxalt PRN and if any worsening discussed with her starting preventative, which she has never taken.  Could consider Amitriptyline or Topamax.  Would avoid BB due to lower resting HR at baseline.

## 2022-10-06 DIAGNOSIS — R011 Cardiac murmur, unspecified: Secondary | ICD-10-CM | POA: Insufficient documentation

## 2022-10-07 LAB — CYTOLOGY - PAP
Comment: NEGATIVE
Diagnosis: NEGATIVE
High risk HPV: NEGATIVE

## 2022-10-08 ENCOUNTER — Ambulatory Visit
Admission: RE | Admit: 2022-10-08 | Discharge: 2022-10-08 | Disposition: A | Discharge status: Home / Self Care | Payer: Managed Care, Other (non HMO) | Source: Ambulatory Visit | Attending: Nurse Practitioner | Admitting: Nurse Practitioner

## 2022-10-08 DIAGNOSIS — R102 Pelvic and perineal pain: Secondary | ICD-10-CM | POA: Diagnosis not present

## 2022-10-08 NOTE — Progress Notes (Signed)
Contacted via MyChart - but not sure she checks, appears not, so please call:   Good day Danielle Fuentes, your pap has returned and overall shows no concerns for cancer.  You do have some vaginal atrophy, meaning dryness and loss of tone of vaginal walls which is common post menopausal and can cause discomfort with intercourse.  Ensure to use lubricant.  Any questions?  Repeat in 5 years.

## 2022-10-13 ENCOUNTER — Telehealth: Payer: Self-pay

## 2022-10-13 NOTE — Telephone Encounter (Signed)
Pt given lab results per notes of [name] on 10/08/22. Pt verbalized understanding. No questions from patient.  Good day Danielle Fuentes, your pap has returned and overall shows no concerns for cancer.  You do have some vaginal atrophy, meaning dryness and loss of tone of vaginal walls which is common post menopausal and can cause discomfort with intercourse.  Ensure to use lubricant.  Any questions?  Repeat in 5 years.  Written by Marjie Skiff, NP on 10/08/2022  5:40 PM EDT

## 2022-10-18 NOTE — Progress Notes (Signed)
Contacted via MyChart -- although please call too as does not appear to use MyChart:(    Good afternoon Danielle Fuentes, your ultrasound returned with no abnormal findings.  Good news.  I suspect discomfort may be from the position of your cervix, so during sex dependent on position this could cause discomfort.  If ongoing we will get you into GYN.  Any questions?

## 2022-10-20 ENCOUNTER — Telehealth: Payer: Self-pay | Admitting: *Deleted

## 2022-10-20 NOTE — Telephone Encounter (Signed)
Patient called for imaging results and notified: Good afternoon Danielle Fuentes, your ultrasound returned with no abnormal findings.  Good news.  I suspect discomfort may be from the position of your cervix, so during sex dependent on position this could cause discomfort.  If ongoing we will get you into GYN.  Any questions?

## 2022-10-30 NOTE — Patient Instructions (Incomplete)
Dyspareunia, Female Dyspareunia is pain that is associated with sexual activity. This can affect any part of the genitals or lower abdomen. There are many possible causes of this condition. In some cases, diagnosing the cause of dyspareunia can be difficult. This condition can be mild, moderate, or severe. Depending on the cause, dyspareunia may get better with treatment, but it may return (recur) over time. What are the causes?  The cause of this condition is not always known. However, problems that affect the outer female genital area (vulva), the vagina, the uterus, and other organs may cause dyspareunia. Common causes of this condition include: Vaginal dryness. Giving birth. Infection. Skin changes or conditions. Side effects of medicines. A condition of severe pain and tenderness of the vulva when it is touched (vulvodynia). Endometriosis. This is when tissue that is like the lining of the uterus grows on the outside of the uterus. Psychological conditions. These include depression, anxiety, or traumatic experiences. Allergic reaction. What increases the risk? The following factors may make you more likely to develop this condition: History of physical or sexual trauma. Some medicines. No longer having a monthly period (menopause). Having recently given birth. Taking baths using soaps that have perfumes. These can cause irritation. Douching. What are the signs or symptoms? The main symptom of this condition is pain in any part of your genitals or lower abdomen during or after sex. This may include: Pain and tenderness of the vulva when it is touched. Irritation, burning, or stinging sensations in your vulva. Aching and throbbing pain that may be constant. Pain that gets worse when something is inserted into your vagina. How is this diagnosed? This condition may be diagnosed based on: Your symptoms, including where and when your pain occurs. Your medical history. A physical  exam. A pelvic exam will most likely be done. Tests, including blood tests and tests that check the body for infection. Imaging tests, such as an ultrasound. You may be referred to a health care provider who specializes in women's health (gynecologist). How is this treated? Treatment for this condition depends on the cause of the condition and your symptoms. Treatment may include: Lubricants, ointments, and creams. Physical therapy. Massage therapy. Hormonal therapy. Medicines to: Prevent or fight infection. Relieve pain. Help numb the area. Treat depression (antidepressants). Counseling, which may include sex therapy. Surgery. In most cases, you may need to stop sexual activity until your symptoms go away or get better. Follow these instructions at home: Lifestyle Wear cotton underwear. Use water-based lubricants as needed during sex. Avoid oil-based lubricants. Do not use any products that can cause irritation. This may include certain condoms, spermicides, lubricants, soaps, tampons, vaginal sprays, or douches. Always practice safe sex. Talk to your health care provider about how to prevent sexually transmitted infections with this condition. Talk freely with your partner about your condition. General instructions Take over-the-counter and prescription medicines only as told by your health care provider. Urinate before you have sex. Consider joining a support group. It is up to you to get the results of any tests you have done. Ask your health care provider, or the department that is doing the tests, when your results will be ready. Keep all follow-up visits. This is important. Contact a health care provider if: You have vaginal bleeding after having sex. You develop a lump at the opening of your vagina even if the lump is painless. You have symptoms that get worse or do not improve with treatment. You have: Abnormal discharge from your   vagina. Vaginal dryness. Itchiness or  irritation of your vulva or vagina. You develop a new rash. You have a fever. You have pain when you urinate or blood in your urine. Summary Dyspareunia is pain that is associated with sexual activity. This can affect any part of the genitals or lower abdomen. There are many causes of this condition. Treatment depends on the cause and your symptoms. In most cases, you may need to stop sexual activity until your symptoms improve. Take over-the-counter and prescription medicines only as told by your health care provider. Contact a health care provider if your symptoms get worse or do not improve with treatment. Keep all follow-up visits. This is important. This information is not intended to replace advice given to you by your health care provider. Make sure you discuss any questions you have with your health care provider. Document Revised: 07/15/2020 Document Reviewed: 07/15/2020 Elsevier Patient Education  2024 [employer].

## 2022-11-02 ENCOUNTER — Ambulatory Visit (INDEPENDENT_AMBULATORY_CARE_PROVIDER_SITE_OTHER): Payer: Managed Care, Other (non HMO) | Admitting: Nurse Practitioner

## 2022-11-02 ENCOUNTER — Encounter: Payer: Self-pay | Admitting: Nurse Practitioner

## 2022-11-02 VITALS — BP 108/67 | HR 76 | Temp 98.5°F | Ht 62.0 in | Wt 130.0 lb

## 2022-11-02 DIAGNOSIS — R102 Pelvic and perineal pain: Secondary | ICD-10-CM | POA: Diagnosis not present

## 2022-11-02 NOTE — Progress Notes (Signed)
BP 108/67   Pulse 76   Temp 98.5 F (36.9 C) (Oral)   Ht 5\' 2"  (1.575 m)   Wt 130 lb (59 kg)   SpO2 98%   BMI 23.78 kg/m    Subjective:    Patient ID: Danielle Fuentes, female    DOB: June 30, 1960, 62 y.o.   MRN: [id number]  HPI: Danielle Fuentes is a 62 y.o. female  Chief Complaint  Patient presents with   Pain    4 week f/up- pt states she has not had intercourse recently   PAINFUL INTERCOURSE Present since December 2023, feels like something is being hit with penetration -- even without intercourse feels like something is there.  No vaginal dryness.  Does not have pain with foreplay.  Not noted at beginning or penetration, but with deep penetration.  No recent intercourse.    Had episiotomy with one child.  G 2 P 2.  No menstrual cycle in 10 years.  Recent pap was normal and vaginal ultrasound reassuring.   On pap exam her cervix was more prominent and tender.  No history of STDs.   Duration: months Pruritus: no Dysuria: no Malodorous: no Urinary frequency: no Fevers: no Sexual activity: monogamous History of sexually transmitted diseases: no  Relevant past medical, surgical, family and social history reviewed and updated as indicated. Interim medical history since our last visit reviewed. Allergies and medications reviewed and updated.  Review of Systems  Constitutional:  Negative for activity change, appetite change, diaphoresis, fatigue and fever.  Respiratory:  Negative for cough, chest tightness and shortness of breath.   Cardiovascular:  Negative for chest pain, palpitations and leg swelling.  Gastrointestinal: Negative.   Neurological: Negative.   Psychiatric/Behavioral: Negative.      Per HPI unless specifically indicated above     Objective:    BP 108/67   Pulse 76   Temp 98.5 F (36.9 C) (Oral)   Ht 5\' 2"  (1.575 m)   Wt 130 lb (59 kg)   SpO2 98%   BMI 23.78 kg/m   Wt Readings from Last 3 Encounters:  11/02/22 130 lb (59 kg)  10/05/22 132 lb 6.4 oz (60.1  kg)  09/20/20 127 lb 13.9 oz (58 kg)    Physical Exam Vitals and nursing note reviewed.  Constitutional:      General: She is awake. She is not in acute distress.    Appearance: She is well-developed and well-groomed. She is not ill-appearing or toxic-appearing.  HENT:     Head: Normocephalic.     Right Ear: Hearing and external ear normal.     Left Ear: Hearing and external ear normal.  Eyes:     General: Lids are normal.        Right eye: No discharge.        Left eye: No discharge.     Conjunctiva/sclera: Conjunctivae normal.     Pupils: Pupils are equal, round, and reactive to light.  Neck:     Thyroid: No thyromegaly.     Vascular: No carotid bruit.  Cardiovascular:     Rate and Rhythm: Normal rate and regular rhythm.     Heart sounds: Normal heart sounds. No murmur heard.    No gallop.  Pulmonary:     Effort: Pulmonary effort is normal. No accessory muscle usage or respiratory distress.     Breath sounds: Normal breath sounds.  Abdominal:     General: Bowel sounds are normal. There is no distension.  Palpations: Abdomen is soft.     Tenderness: There is no abdominal tenderness.  Musculoskeletal:     Cervical back: Normal range of motion and neck supple.     Right lower leg: No edema.     Left lower leg: No edema.  Lymphadenopathy:     Cervical: No cervical adenopathy.  Skin:    General: Skin is warm and dry.  Neurological:     Mental Status: She is alert and oriented to person, place, and time.     Deep Tendon Reflexes: Reflexes are normal and symmetric.     Reflex Scores:      Brachioradialis reflexes are 2+ on the right side and 2+ on the left side.      Patellar reflexes are 2+ on the right side and 2+ on the left side. Psychiatric:        Attention and Perception: Attention normal.        Mood and Affect: Mood normal.        Speech: Speech normal.        Behavior: Behavior normal. Behavior is cooperative.        Thought Content: Thought content normal.      Results for orders placed or performed in visit on 10/05/22  Cytology - PAP  Result Value Ref Range   High risk HPV Negative    Adequacy      Satisfactory for evaluation. The presence or absence of an   Adequacy      endocervical/transformation zone component cannot be determined because   Adequacy of atrophy.    Diagnosis      - Negative for intraepithelial lesion or malignancy (NILM)   Comment Normal Reference Range HPV - Negative       Assessment & Plan:   Problem List Items Addressed This Visit       Other   Vaginal pain - Primary    Cervix very prominent on exam last visit.  Pap and imaging recently reassuring. Will place referral to GYN for further recommendations.  Provided her information on pelvic floor dysfunction as suspect may have some of this present + exercises.  Reports he mother has a history of prolapse and had to have hysterectomy around same age.  Discussed plan with patient.      Relevant Orders   Ambulatory referral to Gynecology     Follow up plan: Return in about 3 months (around 02/14/2023) for Annual exam after 02/10/23.

## 2022-11-02 NOTE — Assessment & Plan Note (Signed)
Cervix very prominent on exam last visit.  Pap and imaging recently reassuring. Will place referral to GYN for further recommendations.  Provided her information on pelvic floor dysfunction as suspect may have some of this present + exercises.  Reports he mother has a history of prolapse and had to have hysterectomy around same age.  Discussed plan with patient.

## 2022-11-23 ENCOUNTER — Encounter: Payer: Managed Care, Other (non HMO) | Admitting: Obstetrics

## 2022-11-23 DIAGNOSIS — R102 Pelvic and perineal pain: Secondary | ICD-10-CM

## 2022-12-17 ENCOUNTER — Encounter: Payer: Managed Care, Other (non HMO) | Admitting: Obstetrics

## 2023-01-30 NOTE — Patient Instructions (Signed)
Please call to schedule your mammogram and/or bone density: [facility]  Address: [address], [place], [place] [address] Phone: [phone]  [facility] [place] [address] [place],  [place]  [address] Phone: [phone]    Healthy Eating, Adult Healthy eating may help you get and keep a healthy body weight, reduce the risk of chronic disease, and live a long and productive life. It is important to follow a healthy eating pattern. Your nutritional and calorie needs should be met mainly by different nutrient-rich foods. What are tips for following this plan? Reading food labels Read labels and choose the following: Reduced or low sodium products. Juices with 100% fruit juice. Foods with low saturated fats (<3 g per serving) and high polyunsaturated and monounsaturated fats. Foods with whole grains, such as whole wheat, cracked wheat, brown rice, and wild rice. Whole grains that are fortified with folic acid. This is recommended for females who are pregnant or who want to become pregnant. Read labels and do not eat or drink the following: Foods or drinks with added sugars. These include foods that contain brown sugar, corn sweetener, corn syrup, dextrose, fructose, glucose, high-fructose corn syrup, honey, invert sugar, lactose, malt syrup, maltose, molasses, raw sugar, sucrose, trehalose, or turbinado sugar. Limit your intake of added sugars to less than 10% of your total daily calories. Do not eat more than the following amounts of added sugar per day: 6 teaspoons (25 g) for females. 9 teaspoons (38 g) for males. Foods that contain processed or refined starches and grains. Refined grain products, such as white flour, degermed cornmeal, white bread, and white rice. Shopping Choose nutrient-rich snacks, such as vegetables, whole fruits, and nuts. Avoid high-calorie and high-sugar snacks, such as potato chips,  fruit snacks, and candy. Use oil-based dressings and spreads on foods instead of solid fats such as butter, margarine, sour cream, or cream cheese. Limit pre-made sauces, mixes, and "instant" products such as flavored rice, instant noodles, and ready-made pasta. Try more plant-protein sources, such as tofu, tempeh, black beans, edamame, lentils, nuts, and seeds. Explore eating plans such as the Mediterranean diet or vegetarian diet. Try heart-healthy dips made with beans and healthy fats like hummus and guacamole. Vegetables go great with these. Cooking Use oil to saut or stir-fry foods instead of solid fats such as butter, margarine, or lard. Try baking, boiling, grilling, or broiling instead of frying. Remove the fatty part of meats before cooking. Steam vegetables in water or broth. Meal planning  At meals, imagine dividing your plate into fourths: One-half of your plate is fruits and vegetables. One-fourth of your plate is whole grains. One-fourth of your plate is protein, especially lean meats, poultry, eggs, tofu, beans, or nuts. Include low-fat dairy as part of your daily diet. Lifestyle Choose healthy options in all settings, including home, work, school, restaurants, or stores. Prepare your food safely: Wash your hands after handling raw meats. Where you prepare food, keep surfaces clean by regularly washing with hot, soapy water. Keep raw meats separate from ready-to-eat foods, such as fruits and vegetables. Cook seafood, meat, poultry, and eggs to the recommended temperature. Get a food thermometer. Store foods at safe temperatures. In general: Keep cold foods at 40F (4.4C) or below. Keep hot foods at 140F (60C) or above. Keep your freezer at 0F (-17.8C) or below. Foods are not safe to eat if they have been between the temperatures of 40-140F (4.4-60C) for more   than 2 hours. What foods should I eat? Fruits Aim to eat 1-2 cups of fresh, canned (in natural juice),  or frozen fruits each day. One cup of fruit equals 1 small apple, 1 large banana, 8 large strawberries, 1 cup (237 g) canned fruit,  cup (82 g) dried fruit, or 1 cup (240 mL) 100% juice. Vegetables Aim to eat 2-4 cups of fresh and frozen vegetables each day, including different varieties and colors. One cup of vegetables equals 1 cup (91 g) broccoli or cauliflower florets, 2 medium carrots, 2 cups (150 g) raw, leafy greens, 1 large tomato, 1 large bell pepper, 1 large sweet potato, or 1 medium white potato. Grains Aim to eat 5-10 ounce-equivalents of whole grains each day. Examples of 1 ounce-equivalent of grains include 1 slice of bread, 1 cup (40 g) ready-to-eat cereal, 3 cups (24 g) popcorn, or  cup (93 g) cooked rice. Meats and other proteins Try to eat 5-7 ounce-equivalents of protein each day. Examples of 1 ounce-equivalent of protein include 1 egg,  oz nuts (12 almonds, 24 pistachios, or 7 walnut halves), 1/4 cup (90 g) cooked beans, 6 tablespoons (90 g) hummus or 1 tablespoon (16 g) peanut butter. A cut of meat or fish that is the size of a deck of cards is about 3-4 ounce-equivalents (85 g). Of the protein you eat each week, try to have at least 8 sounce (227 g) of seafood. This is about 2 servings per week. This includes salmon, trout, herring, sardines, and anchovies. Dairy Aim to eat 3 cup-equivalents of fat-free or low-fat dairy each day. Examples of 1 cup-equivalent of dairy include 1 cup (240 mL) milk, 8 ounces (250 g) yogurt, 1 ounces (44 g) natural cheese, or 1 cup (240 mL) fortified soy milk. Fats and oils Aim for about 5 teaspoons (21 g) of fats and oils per day. Choose monounsaturated fats, such as canola and olive oils, mayonnaise made with olive oil or avocado oil, avocados, peanut butter, and most nuts, or polyunsaturated fats, such as sunflower, corn, and soybean oils, walnuts, pine nuts, sesame seeds, sunflower seeds, and flaxseed. Beverages Aim for 6 eight-ounce glasses of  water per day. Limit coffee to 3-5 eight-ounce cups per day. Limit caffeinated beverages that have added calories, such as soda and energy drinks. If you drink alcohol: Limit how much you have to: 0-1 drink a day if you are female. 0-2 drinks a day if you are female. Know how much alcohol is in your drink. In the [place] one drink is one 12 oz bottle of beer (355 mL), one 5 oz glass of wine (148 mL), or one 1 oz glass of hard liquor (44 mL). Seasoning and other foods Try not to add too much salt to your food. Try using herbs and spices instead of salt. Try not to add sugar to food. This information is based on [place] nutrition guidelines. To learn more, visit [employer]. Exact amounts may vary. You may need different amounts. This information is not intended to replace advice given to you by your health care provider. Make sure you discuss any questions you have with your health care provider. Document Revised: 12/07/2021 Document Reviewed: 12/07/2021 Elsevier Patient Education  2024 [employer].

## 2023-02-02 ENCOUNTER — Encounter: Payer: Self-pay | Admitting: Nurse Practitioner

## 2023-02-02 ENCOUNTER — Ambulatory Visit (INDEPENDENT_AMBULATORY_CARE_PROVIDER_SITE_OTHER): Payer: Managed Care, Other (non HMO) | Admitting: Nurse Practitioner

## 2023-02-02 VITALS — BP 110/69 | HR 69 | Temp 99.0°F | Ht 62.3 in | Wt 133.4 lb

## 2023-02-02 DIAGNOSIS — Z23 Encounter for immunization: Secondary | ICD-10-CM

## 2023-02-02 DIAGNOSIS — Z0000 Encounter for general adult medical examination without abnormal findings: Secondary | ICD-10-CM | POA: Diagnosis not present

## 2023-02-02 DIAGNOSIS — R7303 Prediabetes: Secondary | ICD-10-CM

## 2023-02-02 DIAGNOSIS — Z1159 Encounter for screening for other viral diseases: Secondary | ICD-10-CM

## 2023-02-02 DIAGNOSIS — E039 Hypothyroidism, unspecified: Secondary | ICD-10-CM

## 2023-02-02 DIAGNOSIS — G43109 Migraine with aura, not intractable, without status migrainosus: Secondary | ICD-10-CM

## 2023-02-02 DIAGNOSIS — R011 Cardiac murmur, unspecified: Secondary | ICD-10-CM

## 2023-02-02 DIAGNOSIS — Z136 Encounter for screening for cardiovascular disorders: Secondary | ICD-10-CM

## 2023-02-02 DIAGNOSIS — Z13220 Encounter for screening for lipoid disorders: Secondary | ICD-10-CM

## 2023-02-02 NOTE — Assessment & Plan Note (Signed)
Labs in March 2023 noted A1c 5.8%.  Significant family history of diabetes.  Monitor closely.  She is heavily focused on diet and exercise.  Recheck A1c today.

## 2023-02-02 NOTE — Assessment & Plan Note (Signed)
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 Monitor closely and obtain echo as needed.

## 2023-02-02 NOTE — Progress Notes (Signed)
BP 110/69   Pulse 69   Temp 99 F (37.2 C) (Oral)   Ht 5' 2.3" (1.582 m)   Wt 133 lb 6.4 oz (60.5 kg)   SpO2 93%   BMI 24.16 kg/m    Subjective:    Patient ID: Danielle Fuentes, female    DOB: 1960/11/16, 62 y.o.   MRN: [id number]  HPI: Danielle Fuentes is a 62 y.o. female presenting on 02/02/2023 for comprehensive medical examination. Current medical complaints include:none  She currently lives with: Menopausal Symptoms: no  HYPOTHYROIDISM Currently taking Levothyroxine 125 MCG daily. Thyroid control status:stable Satisfied with current treatment? yes Medication side effects: no Medication compliance: good compliance Etiology of hypothyroidism: unknown Recent dose adjustment:no Fatigue: yes -- lots of issues with work and stress with brother and mother being sick Cold intolerance: yes at baseline Heat intolerance: no Weight gain: no Weight loss: no Constipation: yes at baseline Diarrhea/loose stools: no Palpitations: no Lower extremity edema: no Anxiety/depressed mood: no    PREDIABETES Mom, dad, and brother all have diabetes. Last A1c 5.8% in March.  Polydipsia/polyuria: no Visual disturbance: no Chest pain: no Paresthesias: no   MIGRAINES Getting more recently due to increased stressors with family and work.  3-4 last month. Duration: chronic Quality: dull, aching, and throbbing Frequency: intermittent Location: over right eye Headache duration: 3 days Radiation: no Time of day headache occurs: varies Alleviating factors: Maxalt Aggravating factors: stress and atmosphere changes Headache status at time of visit: asymptomatic Treatments attempted: rest and triptans   Aura: yes Nausea:  yes Vomiting: no Photophobia:  no Phonophobia:  no Effect on social functioning:  no Numbers of missed days of school/work each month: 0 Confusion:  no Gait disturbance/ataxia:  no Behavioral changes:  no Fevers:  no   Functional Status Survey: Is the patient deaf or have  difficulty hearing?: No Does the patient have difficulty seeing, even when wearing glasses/contacts?: No Does the patient have difficulty concentrating, remembering, or making decisions?: No Does the patient have difficulty walking or climbing stairs?: No Does the patient have difficulty dressing or bathing?: No Does the patient have difficulty doing errands alone such as visiting a doctor's office or shopping?: No    Depression Screen done today and results listed below:     02/02/2023    4:09 PM 11/02/2022    3:58 PM 10/05/2022    9:48 AM  Depression screen PHQ 2/9  Decreased Interest 0 0 0  Down, Depressed, Hopeless 0 0 0  PHQ - 2 Score 0 0 0  Altered sleeping 0 0 0  Tired, decreased energy 0 0 0  Change in appetite 0 0 0  Feeling bad or failure about yourself  0 0 0  Trouble concentrating 0 0 0  Moving slowly or fidgety/restless 0 0 0  Suicidal thoughts 0 0 0  PHQ-9 Score 0 0 0  Difficult doing work/chores Not difficult at all Not difficult at all Not difficult at all      02/02/2023    4:09 PM 11/02/2022    3:59 PM 10/05/2022    9:49 AM  GAD 7 : Generalized Anxiety Score  Nervous, Anxious, on Edge 0 0 0  Control/stop worrying 0 0 0  Worry too much - different things 0 0 0  Trouble relaxing 0 0 0  Restless 0 0 0  Easily annoyed or irritable 0 0 0  Afraid - awful might happen 0 0 0  Total GAD 7 Score 0 0 0  Anxiety Difficulty Not difficult at all Not difficult at all Not difficult at all      07/28/2020    9:07 PM 09/20/2020    5:39 PM 10/05/2022    9:48 AM 11/02/2022    3:58 PM 02/02/2023    4:09 PM  Fall Risk  Falls in the past year?   0 0 0  Was there an injury with Fall?   0 0 0  Fall Risk Category Calculator   0 0 0  (RETIRED) Patient Fall Risk Level Low fall risk Low fall risk     Patient at Risk for Falls Due to   No Fall Risks No Fall Risks No Fall Risks  Fall risk Follow up    Falls evaluation completed Falls evaluation completed    Past Medical History:   Past Medical History:  Diagnosis Date   Arthritis    Asthma    Prediabetes    Thyroid disease     Surgical History:  Past Surgical History:  Procedure Laterality Date   APPENDECTOMY      Medications:  Current Outpatient Medications on File Prior to Visit  Medication Sig   levothyroxine (SYNTHROID) 125 MCG tablet Take 125 mcg by mouth daily before breakfast.   loratadine (CLARITIN) 10 MG tablet Take 10 mg by mouth daily.   rizatriptan (MAXALT-MLT) 10 MG disintegrating tablet Take 10 mg by mouth daily as needed for migraine. May repeat after 2 hours with maximum of 3 doses in 24 hours and 12 dose per month.   No current facility-administered medications on file prior to visit.    Allergies:  No Known Allergies  Social History:  Social History   Socioeconomic History   Marital status: Married    Spouse name: Not on file   Number of children: Not on file   Years of education: Not on file   Highest education level: Associate degree: occupational, Scientist, product/process development, or vocational program  Occupational History   Not on file  Tobacco Use   Smoking status: Former    Current packs/day: 0.00    Types: Cigarettes    Quit date: 2002    Years since quitting: 22.8   Smokeless tobacco: Never  Vaping Use   Vaping status: Never Used  Substance and Sexual Activity   Alcohol use: Not Currently   Drug use: Never   Sexual activity: Yes    Birth control/protection: None  Other Topics Concern   Not on file  Social History Narrative   Not on file   Social Determinants of Health   Financial Resource Strain: Low Risk  (02/01/2023)   Overall Financial Resource Strain (CARDIA)    Difficulty of Paying Living Expenses: Not very hard  Food Insecurity: No Food Insecurity (02/01/2023)   Hunger Vital Sign    Worried About Running Out of Food in the Last Year: Never true    Ran Out of Food in the Last Year: Never true  Transportation Needs: No Transportation Needs (02/01/2023)   PRAPARE -  Administrator, Civil Service (Medical): No    Lack of Transportation (Non-Medical): No  Physical Activity: Insufficiently Active (02/01/2023)   Exercise Vital Sign    Days of Exercise per Week: 3 days    Minutes of Exercise per Session: 30 min  Stress: No Stress Concern Present (02/01/2023)   [employer] of Occupational Health - Occupational Stress Questionnaire    Feeling of Stress : Not at all  Social Connections: Moderately Integrated (02/01/2023)  Social Advertising account executive [NHANES]    Frequency of Communication with Friends and Family: Once a week    Frequency of Social Gatherings with Friends and Family: Once a week    Attends Religious Services: More than 4 times per year    Active Member of [employer] or Organizations: Yes    Attends Engineer, structural: More than 4 times per year    Marital Status: Married  Catering manager Violence: Not At Risk (10/05/2022)   Humiliation, Afraid, Rape, and Kick questionnaire    Fear of Current or Ex-Partner: No    Emotionally Abused: No    Physically Abused: No    Sexually Abused: No   Social History   Tobacco Use  Smoking Status Former   Current packs/day: 0.00   Types: Cigarettes   Quit date: 2002   Years since quitting: 22.8  Smokeless Tobacco Never   Social History   Substance and Sexual Activity  Alcohol Use Not Currently    Family History:  Family History  Problem Relation Age of Onset   Diabetes Mother    Hyperlipidemia Mother    Heart disease Father    Diabetes Father    Kidney failure Father    Diabetes Brother    Diabetes Maternal Grandfather    Heart disease Paternal Grandmother     Past medical history, surgical history, medications, allergies, family history and social history reviewed with patient today and changes made to appropriate areas of the chart.   ROS All other ROS negative except what is listed above and in the HPI.      Objective:    BP 110/69   Pulse  69   Temp 99 F (37.2 C) (Oral)   Ht 5' 2.3" (1.582 m)   Wt 133 lb 6.4 oz (60.5 kg)   SpO2 93%   BMI 24.16 kg/m   Wt Readings from Last 3 Encounters:  02/02/23 133 lb 6.4 oz (60.5 kg)  11/02/22 130 lb (59 kg)  10/05/22 132 lb 6.4 oz (60.1 kg)    Physical Exam Vitals and nursing note reviewed. Exam conducted with a chaperone present.  Constitutional:      General: She is awake. She is not in acute distress.    Appearance: She is well-developed and well-groomed. She is not ill-appearing or toxic-appearing.  HENT:     Head: Normocephalic and atraumatic.     Right Ear: Hearing, tympanic membrane, ear canal and external ear normal. No drainage.     Left Ear: Hearing, tympanic membrane, ear canal and external ear normal. No drainage.     Nose: Nose normal.     Right Sinus: No maxillary sinus tenderness or frontal sinus tenderness.     Left Sinus: No maxillary sinus tenderness or frontal sinus tenderness.     Mouth/Throat:     Mouth: Mucous membranes are moist.     Pharynx: Oropharynx is clear. Uvula midline. No pharyngeal swelling, oropharyngeal exudate or posterior oropharyngeal erythema.  Eyes:     General: Lids are normal.        Right eye: No discharge.        Left eye: No discharge.     Extraocular Movements: Extraocular movements intact.     Conjunctiva/sclera: Conjunctivae normal.     Pupils: Pupils are equal, round, and reactive to light.     Visual Fields: Right eye visual fields normal and left eye visual fields normal.  Neck:     Thyroid: No thyromegaly.  Vascular: No carotid bruit.     Trachea: Trachea normal.  Cardiovascular:     Rate and Rhythm: Normal rate and regular rhythm.     Heart sounds: Murmur heard.     Systolic murmur is present with a grade of 2/6.     No gallop.  Pulmonary:     Effort: Pulmonary effort is normal. No accessory muscle usage or respiratory distress.     Breath sounds: Normal breath sounds.  Chest:  Breasts:    Right: Normal.      Left: Normal.  Abdominal:     General: Bowel sounds are normal.     Palpations: Abdomen is soft. There is no hepatomegaly or splenomegaly.     Tenderness: There is no abdominal tenderness.  Musculoskeletal:        General: Normal range of motion.     Cervical back: Normal range of motion and neck supple.     Right lower leg: No edema.     Left lower leg: No edema.  Lymphadenopathy:     Head:     Right side of head: No submental, submandibular, tonsillar, preauricular or posterior auricular adenopathy.     Left side of head: No submental, submandibular, tonsillar, preauricular or posterior auricular adenopathy.     Cervical: No cervical adenopathy.     Upper Body:     Right upper body: No supraclavicular, axillary or pectoral adenopathy.     Left upper body: No supraclavicular, axillary or pectoral adenopathy.  Skin:    General: Skin is warm and dry.     Capillary Refill: Capillary refill takes less than 2 seconds.     Findings: No rash.  Neurological:     Mental Status: She is alert and oriented to person, place, and time.     Gait: Gait is intact.     Deep Tendon Reflexes: Reflexes are normal and symmetric.     Reflex Scores:      Brachioradialis reflexes are 2+ on the right side and 2+ on the left side.      Patellar reflexes are 2+ on the right side and 2+ on the left side. Psychiatric:        Attention and Perception: Attention normal.        Mood and Affect: Mood normal.        Speech: Speech normal.        Behavior: Behavior normal. Behavior is cooperative.        Thought Content: Thought content normal.        Judgment: Judgment normal.     Results for orders placed or performed in visit on 10/05/22  Cytology - PAP  Result Value Ref Range   High risk HPV Negative    Adequacy      Satisfactory for evaluation. The presence or absence of an   Adequacy      endocervical/transformation zone component cannot be determined because   Adequacy of atrophy.    Diagnosis       - Negative for intraepithelial lesion or malignancy (NILM)   Comment Normal Reference Range HPV - Negative       Assessment & Plan:   Problem List Items Addressed This Visit       Cardiovascular and Mediastinum   Migraine    Chronic, stable.  Currently exacerbated due to stressors.  Continue Maxalt PRN and if any worsening discussed with her starting preventative, which she has never taken.  Could consider Amitriptyline or Topamax.  Would avoid  BB due to lower resting HR at baseline.      Relevant Orders   CBC with Differential/Platelet   Comprehensive metabolic panel     Endocrine   Hypothyroidism, acquired - Primary    Chronic, stable.  Recent labs in March within normal ranges.  Continue current dose of Levothyroxine and adjust as needed.  Plan on thyroid labs today.      Relevant Orders   TSH   T4, free     Other   Heart murmur    Systolic with no symptoms. Monitor closely and obtain echo as needed.      Prediabetes    Labs in March 2023 noted A1c 5.8%.  Significant family history of diabetes.  Monitor closely.  She is heavily focused on diet and exercise.  Recheck A1c today.      Relevant Orders   HgB A1c   Other Visit Diagnoses     Encounter for lipid screening for cardiovascular disease       Lipid panel today.   Relevant Orders   Lipid Panel w/o Chol/HDL Ratio   Need for hepatitis C screening test       Hep C screening today, discussed with patient.   Relevant Orders   Hepatitis C antibody   Flu vaccine need       Flu vaccine today, educated patient.   Relevant Orders   Flu vaccine trivalent PF, 6mos and older(Flulaval,Afluria,Fluarix,Fluzone) (Completed)   Encounter for annual physical exam       Annual physical today with labs and health maintenance reviewed, discussed with patient.        Follow up plan: Return in about 6 months (around 08/02/2023) for Thyroid and migraines.   LABORATORY TESTING:  - Pap smear: up to date  IMMUNIZATIONS:    - Tdap: Tetanus vaccination status reviewed: last tetanus booster within 10 years. - Influenza: Up to date - Pneumovax: Not applicable - Prevnar: Not applicable - COVID: Up to date - HPV: Not applicable - Shingrix vaccine: Up to date  SCREENING: -Mammogram:  ordered and will schedule   - Colonoscopy: will schedule in future, a lot going on right now -- we discussed possibly doing Cologuard instead  - Bone Density: Not applicable  -Hearing Test: Not applicable  -Spirometry: Not applicable   PATIENT COUNSELING:   Advised to take 1 mg of folate supplement per day if capable of pregnancy.   Sexuality: Discussed sexually transmitted diseases, partner selection, use of condoms, avoidance of unintended pregnancy  and contraceptive alternatives.   Advised to avoid cigarette smoking.  I discussed with the patient that most people either abstain from alcohol or drink within safe limits (<=14/week and <=4 drinks/occasion for males, <=7/weeks and <= 3 drinks/occasion for females) and that the risk for alcohol disorders and other health effects rises proportionally with the number of drinks per week and how often a drinker exceeds daily limits.  Discussed cessation/primary prevention of drug use and availability of treatment for abuse.   Diet: Encouraged to adjust caloric intake to maintain  or achieve ideal body weight, to reduce intake of dietary saturated fat and total fat, to limit sodium intake by avoiding high sodium foods and not adding table salt, and to maintain adequate dietary potassium and calcium preferably from fresh fruits, vegetables, and low-fat dairy products.    Stressed the importance of regular exercise  Injury prevention: Discussed safety belts, safety helmets, smoke detector, smoking near bedding or upholstery.   Dental health: Discussed importance of  regular tooth brushing, flossing, and dental visits.    NEXT PREVENTATIVE PHYSICAL DUE IN 1 YEAR. Return in about 6  months (around 08/02/2023) for Thyroid and migraines.

## 2023-02-02 NOTE — Assessment & Plan Note (Signed)
Chronic, stable.  Recent labs in March within normal ranges.  Continue current dose of Levothyroxine and adjust as needed.  Plan on thyroid labs today.

## 2023-02-02 NOTE — Assessment & Plan Note (Signed)
Chronic, stable.  Currently exacerbated due to stressors.  Continue Maxalt PRN and if any worsening discussed with her starting preventative, which she has never taken.  Could consider Amitriptyline or Topamax.  Would avoid BB due to lower resting HR at baseline.

## 2023-02-03 LAB — CBC WITH DIFFERENTIAL/PLATELET
Basophils Absolute: 0 10*3/uL (ref 0.0–0.2)
Basos: 1 %
EOS (ABSOLUTE): 0.1 10*3/uL (ref 0.0–0.4)
Eos: 2 %
Hematocrit: 42 % (ref 34.0–46.6)
Hemoglobin: 13.9 g/dL (ref 11.1–15.9)
Immature Grans (Abs): 0 10*3/uL (ref 0.0–0.1)
Immature Granulocytes: 0 %
Lymphocytes Absolute: 2.2 10*3/uL (ref 0.7–3.1)
Lymphs: 42 %
MCH: 30 pg (ref 26.6–33.0)
MCHC: 33.1 g/dL (ref 31.5–35.7)
MCV: 91 fL (ref 79–97)
Monocytes Absolute: 0.4 10*3/uL (ref 0.1–0.9)
Monocytes: 7 %
Neutrophils Absolute: 2.5 10*3/uL (ref 1.4–7.0)
Neutrophils: 48 %
Platelets: 203 10*3/uL (ref 150–450)
RBC: 4.64 x10E6/uL (ref 3.77–5.28)
RDW: 12.3 % (ref 11.7–15.4)
WBC: 5.3 10*3/uL (ref 3.4–10.8)

## 2023-02-03 LAB — HEMOGLOBIN A1C
Est. average glucose Bld gHb Est-mCnc: 126 mg/dL
Hgb A1c MFr Bld: 6 % — ABNORMAL HIGH (ref 4.8–5.6)

## 2023-02-03 LAB — COMPREHENSIVE METABOLIC PANEL
ALT: 22 [IU]/L (ref 0–32)
AST: 24 [IU]/L (ref 0–40)
Albumin: 4.8 g/dL (ref 3.8–4.9)
Alkaline Phosphatase: 71 [IU]/L (ref 44–121)
BUN/Creatinine Ratio: 24 (ref 12–28)
BUN: 22 mg/dL (ref 8–27)
Bilirubin Total: 0.3 mg/dL (ref 0.0–1.2)
CO2: 23 mmol/L (ref 20–29)
Calcium: 9.6 mg/dL (ref 8.7–10.3)
Chloride: 101 mmol/L (ref 96–106)
Creatinine, Ser: 0.9 mg/dL (ref 0.57–1.00)
Globulin, Total: 2.3 g/dL (ref 1.5–4.5)
Glucose: 79 mg/dL (ref 70–99)
Potassium: 4.3 mmol/L (ref 3.5–5.2)
Sodium: 142 mmol/L (ref 134–144)
Total Protein: 7.1 g/dL (ref 6.0–8.5)
eGFR: 73 mL/min/{1.73_m2} (ref >59)

## 2023-02-03 LAB — HEPATITIS C ANTIBODY: Hep C Virus Ab: NONREACTIVE

## 2023-02-03 LAB — LIPID PANEL W/O CHOL/HDL RATIO
Cholesterol, Total: 199 mg/dL (ref 100–199)
HDL: 55 mg/dL (ref >39)
LDL Chol Calc (NIH): 126 mg/dL — ABNORMAL HIGH (ref 0–99)
Triglycerides: 98 mg/dL (ref 0–149)
VLDL Cholesterol Cal: 18 mg/dL (ref 5–40)

## 2023-02-03 LAB — TSH: TSH: 2.2 u[IU]/mL (ref 0.450–4.500)

## 2023-02-03 LAB — T4, FREE: Free T4: 1.59 ng/dL (ref 0.82–1.77)

## 2023-02-03 NOTE — Progress Notes (Signed)
Contacted via MyChart The 10-year ASCVD risk score (Arnett DK, et al., 2019) is: 2.5%   Values used to calculate the score:     Age: 62 years     Sex: Female     Is Non-Hispanic African American: No     Diabetic: No     Tobacco smoker: No     Systolic Blood Pressure: 110 mmHg     Is BP treated: No     HDL Cholesterol: 55 mg/dL     Total Cholesterol: 199 mg/dL   Good evening Danielle Fuentes, your labs have returned: - Kidney function, creatinine and eGFR, remains normal, as is liver function, AST and ALT.  - Thyroid labs stable, no medication changes needed. - LDL, bad cholesterol, is elevated -- focus heavily on healthy diet and regular exercise. - The A1c is the diabetes testing we talked about, this looks at your blood sugars over the past 3 months and turns the average into a number.  Your number is 6%, meaning you are prediabetic, this has trended up from previous 5.8%.  Any number 5.7 to 6.4 is considered prediabetes and any number 6.5 or greater is considered diabetes.   I would recommend heavy focus on decreasing foods high in sugar and your intake of things like bread products, pasta, and rice.  The American Diabetes Association online has a large amount of information on diet changes to make.  We will recheck at future visit. - Remainder of labs stable.  Any questions? Keep being amazing!!  Thank you for allowing me to participate in your care.  I appreciate you. Kindest regards, Isabela Nardelli

## 2023-03-12 ENCOUNTER — Telehealth: Payer: Self-pay | Admitting: Physician Assistant

## 2023-03-12 ENCOUNTER — Ambulatory Visit
Admission: EM | Admit: 2023-03-12 | Discharge: 2023-03-12 | Disposition: A | Discharge status: Home / Self Care | Payer: Managed Care, Other (non HMO) | Attending: Family Medicine | Admitting: Family Medicine

## 2023-03-12 ENCOUNTER — Encounter: Payer: Self-pay | Admitting: Emergency Medicine

## 2023-03-12 DIAGNOSIS — N952 Postmenopausal atrophic vaginitis: Secondary | ICD-10-CM | POA: Insufficient documentation

## 2023-03-12 DIAGNOSIS — R102 Pelvic and perineal pain: Secondary | ICD-10-CM

## 2023-03-12 LAB — URINALYSIS, W/ REFLEX TO CULTURE (INFECTION SUSPECTED)
Bilirubin Urine: NEGATIVE
Glucose, UA: NEGATIVE mg/dL
Hgb urine dipstick: NEGATIVE
Ketones, ur: NEGATIVE mg/dL
Nitrite: NEGATIVE
Protein, ur: NEGATIVE mg/dL
Specific Gravity, Urine: 1.01 (ref 1.005–1.030)
pH: 5.5 (ref 5.0–8.0)

## 2023-03-12 LAB — WET PREP, GENITAL
Clue Cells Wet Prep HPF POC: NONE SEEN
Sperm: NONE SEEN
Trich, Wet Prep: NONE SEEN
WBC, Wet Prep HPF POC: >=10 — AB (ref <10)
Yeast Wet Prep HPF POC: NONE SEEN

## 2023-03-12 MED ORDER — ESTRADIOL 0.1 MG/GM VA CREA: 1.0000 | TOPICAL_CREAM | Freq: Every day | VAGINAL | 0 refills | Status: DC

## 2023-03-12 NOTE — ED Provider Notes (Signed)
[facility]    CSN: [id number] Arrival date & time: 03/12/23  1415      History   Chief Complaint Chief Complaint  Patient presents with   Vaginal Pain    HPI 62 year old female presents with vaginal burning.  Symptoms over the past week.  She reports internal vaginal burning.  No discharge.  No urinary symptoms.  She has had vaginal pain in the past and recently saw her primary care physician but states that this is different.  No recent sexual intercourse.  Pain 6/10 in severity.  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      Home Medications    Prior to Admission medications   Medication Sig Start Date End Date Taking? Authorizing Provider  estradiol (ESTRACE VAGINAL) 0.1 MG/GM vaginal cream Place 1 Applicatorful vaginally at bedtime. 03/12/23  Yes Tommie Sams, DO  levothyroxine (SYNTHROID) 125 MCG tablet Take 125 mcg by mouth daily before breakfast. 06/09/22  Yes [provider]  loratadine (CLARITIN) 10 MG tablet Take 10 mg by mouth daily.    [provider]  rizatriptan (MAXALT-MLT) 10 MG disintegrating tablet Take 10 mg by mouth daily as needed for migraine. May repeat after 2 hours with maximum of 3 doses in 24 hours and 12 dose per month. 02/18/22 02/17/23  [provider]    Family History Family History  Problem Relation Age of Onset   Diabetes Mother    Hyperlipidemia Mother    Heart disease Father    Diabetes Father    Kidney failure Father     Diabetes Brother    Diabetes Maternal Grandfather    Heart disease Paternal Grandmother     Social History Social History   Tobacco Use   Smoking status: Former    Current packs/day: 0.00    Types: Cigarettes    Quit date: 2002    Years since quitting: 22.9   Smokeless tobacco: Never  Vaping Use   Vaping status: Never Used  Substance Use Topics   Alcohol use: Not Currently   Drug use: Never     Allergies   Patient has no known allergies.   Review of Systems Review of Systems  Genitourinary:  Positive for vaginal pain.     Physical Exam Triage Vital Signs ED Triage Vitals  Encounter Vitals Group     BP 03/12/23 1502 123/69     Systolic BP Percentile --      Diastolic BP Percentile --      Pulse Rate 03/12/23 1502 64     Resp 03/12/23 1502 14     Temp 03/12/23 1502 98.3 F (36.8 C)     Temp Source 03/12/23 1502 Oral     SpO2 03/12/23 1502 100 %     Weight 03/12/23 1459 130 lb (59 kg)     Height 03/12/23 1459 5' 2.3" (1.582 m)     Head Circumference --      Peak  Flow --      Pain Score 03/12/23 1459 6     Pain Loc --      Pain Education --      Exclude from Growth Chart --    No data found.  Updated Vital Signs BP 123/69 (BP Location: Left Arm)   Pulse 64   Temp 98.3 F (36.8 C) (Oral)   Resp 14   Ht 5' 2.3" (1.582 m)   Wt 59 kg   SpO2 100%   BMI 23.55 kg/m   Visual Acuity Right Eye Distance:   Left Eye Distance:   Bilateral Distance:    Right Eye Near:   Left Eye Near:    Bilateral Near:     Physical Exam Vitals and nursing note reviewed. Exam conducted with a chaperone present.  Constitutional:      General: She is not in acute distress.    Appearance: Normal appearance.  HENT:     Head: Normocephalic and atraumatic.  Pulmonary:     Effort: Pulmonary effort is normal. No respiratory distress.  Genitourinary:    Comments: Chaperone, Kennieth Francois CMA  On normal-appearing vulva.  There is an area of erythema at the vaginal  introitus.  Mild discomfort with speculum exam.  Normal cervix.  No vaginal discharge. Neurological:     Mental Status: She is alert.      UC Treatments / Results  Labs (all labs ordered are listed, but only abnormal results are displayed) Labs Reviewed  WET PREP, GENITAL - Abnormal; Notable for the following components:      Result Value   WBC, Wet Prep HPF POC >=10 (*)    All other components within normal limits  URINALYSIS, W/ REFLEX TO CULTURE (INFECTION SUSPECTED) - Abnormal; Notable for the following components:   APPearance HAZY (*)    Leukocytes,Ua SMALL (*)    Bacteria, UA FEW (*)    All other components within normal limits    EKG   Radiology No results found.  Procedures Procedures (including critical care time)  Medications Ordered in UC Medications - No data to display  Initial Impression / Assessment and Plan / UC Course  I have reviewed the triage vital signs and the nursing notes.  Pertinent labs & imaging results that were available during my care of the patient were reviewed by me and considered in my medical decision making (see chart for details).    62 year old female presents with vaginal pain.  Suspected atrophic vaginitis.  Estradiol as prescribed.  Needs follow-up with OB/GYN.  Final Clinical Impressions(s) / UC Diagnoses   Final diagnoses:  Atrophic vaginitis   Discharge Instructions   None    ED Prescriptions     Medication Sig Dispense Auth. Provider   estradiol (ESTRACE VAGINAL) 0.1 MG/GM vaginal cream Place 1 Applicatorful vaginally at bedtime. 42.5 g Tommie Sams, DO      PDMP not reviewed this encounter.   Tommie Sams, [place] 03/12/23 [phone]

## 2023-03-12 NOTE — Progress Notes (Signed)
Because of your symptoms, I feel your condition warrants further evaluation and I recommend that you be seen in a face to face visit.   NOTE: There will be NO CHARGE for this eVisit   If you are having a true medical emergency please call 911.      For an urgent face to face visit, [facility] has eight urgent care centers for your convenience:   NEW!! [facility] at [facility] Get Driving Directions [phone] [address], [address] [place], [address]    [facility] Health Urgent Care Center at [facility] Get Driving Directions [phone] [address] [place], [place] [address]   [facility] Health Urgent Care Center [facility]) Get Driving Directions [phone] [address] [place], [place] [address]  [facility] Health Urgent Care Center [facility]) Get Driving Directions [phone] [address] [place],  [place]  [address]  [facility] Health Urgent Care Center [facility] - at [employer]  [phone] [address] [place],  [place] [address]   [facility] Health Urgent Care at [facility] Get Driving Directions [phone] [address], [address] [place], [place] [address]   [facility] Health Urgent Care at [facility] Get Driving Directions  [phone] [address] [place], [place] [address]   [facility] Health Urgent Care at [facility] Directions [phone] [address], [address] [place], [place] [address]  Your MyChart E-visit questionnaire answers were reviewed by a board certified advanced clinical practitioner to complete your personal care plan based on your specific symptoms.  Thank you for using e-Visits.

## 2023-03-12 NOTE — ED Triage Notes (Signed)
Patient reports vaginal pain for the past 3-4 days.  Patient denies any discharge.  Patient denies any urinary symptoms.

## 2023-03-29 ENCOUNTER — Encounter: Payer: Managed Care, Other (non HMO) | Admitting: Licensed Practical Nurse

## 2023-04-11 ENCOUNTER — Encounter: Payer: Self-pay | Admitting: Licensed Practical Nurse

## 2023-04-11 ENCOUNTER — Ambulatory Visit (INDEPENDENT_AMBULATORY_CARE_PROVIDER_SITE_OTHER): Payer: Self-pay | Admitting: Licensed Practical Nurse

## 2023-04-11 ENCOUNTER — Other Ambulatory Visit (HOSPITAL_COMMUNITY)
Admission: RE | Admit: 2023-04-11 | Discharge: 2023-04-11 | Disposition: A | Discharge status: Home / Self Care | Payer: Self-pay | Source: Ambulatory Visit | Attending: Licensed Practical Nurse | Admitting: Licensed Practical Nurse

## 2023-04-11 VITALS — BP 128/69 | HR 78 | Ht 62.0 in | Wt 135.0 lb

## 2023-04-11 DIAGNOSIS — N9489 Other specified conditions associated with female genital organs and menstrual cycle: Secondary | ICD-10-CM

## 2023-04-11 DIAGNOSIS — N905 Atrophy of vulva: Secondary | ICD-10-CM

## 2023-04-11 DIAGNOSIS — R102 Pelvic and perineal pain: Secondary | ICD-10-CM

## 2023-04-11 NOTE — Assessment & Plan Note (Addendum)
Continue estrace cream, half a dose every other day Return in 4-6 weeks Consider Clobetasol cream if no improvement

## 2023-04-11 NOTE — Progress Notes (Unsigned)
   Gynecology Pelvic Pain Evaluation   Chief Complaint: No chief complaint on file.   History of Present Illness:   Patient is a 63 y.o. No obstetric history on file. who LMP was No LMP recorded. Patient is postmenopausal., presents today for a problem visit.  She complains of pain with IC, vaginal burning.   Cincere had IC in June, at that time she experienced significant pain. D/t life circumstances she has not had IC since then (her husband has "his own issues". She is caring for her mother etc). She saw her PCP in August regarding this pain, she had a normal pelvic US and pap, she was instructed to follow up with GYN. She does feel vaginal burning, it was so bad she went to the ED on 12/21. She was given estrace cream and discharged home. She used the estrace cream as instructed but it caused labial swelling.  For the last week she has been using about half the dose every other day, she inserts  the medication into her vaginal. She is not seeing an improvement. She has not noticed any changes to her discharge, she uses a mild body wash. When she did have IC her partner is able to enter without discomfort, but she had pain in her lower abdomen.     PMHx: She  has a past medical history of Arthritis, Asthma, Prediabetes, and Thyroid disease. Also,  has a past surgical history that includes Appendectomy., family history includes Diabetes in her brother, father, maternal grandfather, and mother; Heart disease in her father and paternal grandmother; Hyperlipidemia in her mother; Kidney failure in her father.,  reports that she quit smoking about 23 years ago. Her smoking use included cigarettes. She has never used smokeless tobacco. She reports that she does not currently use alcohol. She reports that she does not use drugs.  She has a current medication list which includes the following prescription(s): estradiol, levothyroxine, loratadine, and rizatriptan. Also, has no known allergies.  ROS see HPI    Objective: BP 128/69 (BP Location: Left Arm, Patient Position: Sitting, Cuff Size: Normal)   Pulse 78   Ht 5\' 2"  (1.575 m)   Wt 135 lb (61.2 kg)   BMI 24.69 kg/m  Physical Exam Constitutional:      Appearance: Normal appearance.  Genitourinary:     Genitourinary Comments: External genitalia WNL Internal labia: thin red line along both labia, this is were the pt reports the irritation. Skin intact, with mild thinning, thin white discharge present at introitus. Swab collected   Cardiovascular:     Rate and Rhythm: Normal rate.  Pulmonary:     Effort: Pulmonary effort is normal.  Neurological:     Mental Status: She is alert.     Assessment: 63 y.o. No obstetric history on file. with   Problem List Items Addressed This Visit       Other   Vaginal pain   Continue estrace cream, half a dose every other day Return in 4-6 weeks Consider Clobetasol cream if no improvement       Other Visit Diagnoses       Vulvar atrophy    -  Primary     Vaginal burning       Relevant Orders   Cervicovaginal ancillary only       Dr Logan Bores called, reviewed PE,  assessment and plan   Carie Caddy, CNM  [facility]  04/11/23  3:51 PM

## 2023-04-13 LAB — CERVICOVAGINAL ANCILLARY ONLY
Bacterial Vaginitis (gardnerella): NEGATIVE
Candida Glabrata: NEGATIVE
Candida Vaginitis: NEGATIVE
Comment: NEGATIVE
Comment: NEGATIVE
Comment: NEGATIVE

## 2023-05-10 ENCOUNTER — Encounter: Payer: Self-pay | Admitting: Licensed Practical Nurse

## 2023-05-10 ENCOUNTER — Other Ambulatory Visit (HOSPITAL_COMMUNITY)
Admission: RE | Admit: 2023-05-10 | Discharge: 2023-05-10 | Disposition: A | Discharge status: Home / Self Care | Payer: Self-pay | Source: Ambulatory Visit | Attending: Licensed Practical Nurse | Admitting: Licensed Practical Nurse

## 2023-05-10 ENCOUNTER — Ambulatory Visit (INDEPENDENT_AMBULATORY_CARE_PROVIDER_SITE_OTHER): Payer: Self-pay | Admitting: Licensed Practical Nurse

## 2023-05-10 VITALS — BP 130/79 | HR 74 | Ht 62.0 in | Wt 135.0 lb

## 2023-05-10 DIAGNOSIS — Z09 Encounter for follow-up examination after completed treatment for conditions other than malignant neoplasm: Secondary | ICD-10-CM

## 2023-05-10 DIAGNOSIS — R102 Pelvic and perineal pain: Secondary | ICD-10-CM | POA: Insufficient documentation

## 2023-05-10 DIAGNOSIS — N952 Postmenopausal atrophic vaginitis: Secondary | ICD-10-CM

## 2023-05-10 DIAGNOSIS — Z01419 Encounter for gynecological examination (general) (routine) without abnormal findings: Secondary | ICD-10-CM | POA: Insufficient documentation

## 2023-05-10 DIAGNOSIS — N9489 Other specified conditions associated with female genital organs and menstrual cycle: Secondary | ICD-10-CM | POA: Insufficient documentation

## 2023-05-10 MED ORDER — ESTRADIOL 0.1 MG/GM VA CREA: 1.0000 | TOPICAL_CREAM | Freq: Every day | VAGINAL | 0 refills | Status: DC

## 2023-05-10 NOTE — Progress Notes (Unsigned)
 Gynecology Pelvic Pain Evaluation   Chief Complaint: No chief complaint on file.   History of Present Illness:   Patient is a 63 y.o. No obstetric history on file. who LMP was No LMP recorded. Patient is postmenopausal., presents today for a follow up visit for pelvic pain. She was seen on 1/20 for pelvic pain with IC and vaginal burning. She was recently started on estrogen. Was instructed to use estrogen and return in 1 month as she may have not given the treatment enough time.   Has been using estrogen cream every other day with no improvement in symptoms. She uses the applicator to apply the cream. She has not had IC since the last visit-mainly d/t her husband's issues. She feels discomfort constantly in her her vagina, it is worse when she is bending over to blow dry her hair. The discomfort is internal but is starting to feel like it near the outside too. Her husband is expressing more interested  in sex, so she would like to be able to have comfortable IC.    PMHx: She  has a past medical history of Arthritis, Asthma, Prediabetes, and Thyroid disease. Also,  has a past surgical history that includes Appendectomy., family history includes Diabetes in her brother, father, maternal grandfather, and mother; Heart disease in her father and paternal grandmother; Hyperlipidemia in her mother; Kidney failure in her father.,  reports that she quit smoking about 23 years ago. Her smoking use included cigarettes. She has never used smokeless tobacco. She reports that she does not currently use alcohol. She reports that she does not use drugs.  She has a current medication list which includes the following prescription(s): estradiol, levothyroxine, loratadine, and rizatriptan. Also, has no known allergies.  ROS see HPI   Objective: BP 130/79   Pulse 74   Ht 5\' 2"  (1.575 m)   Wt 135 lb (61.2 kg)   BMI 24.69 kg/m  Physical Exam Constitutional:      Appearance: Normal appearance.   Genitourinary:     Genitourinary Comments: Right labia slightly swollen, thin red line noted on previous exam present but barely visible. No tenderness with palpation of labia  Able to easily introduce 2 fingers into vagina, some tone present, tenderness  noted on Right vaginal wall when palpated.   No obvious prolapse   Cervix pink, no lesions, creamy white discharge present      Vaginal discharge present.  Pulmonary:     Effort: Pulmonary effort is normal.  Abdominal:     General: Abdomen is flat.     Tenderness: There is no abdominal tenderness.  Neurological:     General: No focal deficit present.     Mental Status: She is alert.  Skin:    General: Skin is warm.  Psychiatric:        Mood and Affect: Mood normal.      Assessment: 63 y.o. No obstetric history on file. with  Pelvic pain .  1. Visit for pelvic exam (Primary) - Ambulatory referral to Obstetrics / Gynecology - Cervicovaginal ancillary only  2. Vaginal pain -Rec pelvic PT-pt concerned about cost as she does not have insurance.  -referral to [facility] made -continue estrogen cream, use applicator and apply some cream to index finger and apply directly to the painful area  - Cervicovaginal ancillary only  3. Vaginal burning - Cervicovaginal ancillary only  Problem List Items Addressed This Visit       Other   Vaginal  pain   Relevant Medications   estradiol (ESTRACE VAGINAL) 0.1 MG/GM vaginal cream   Other Relevant Orders   Cervicovaginal ancillary only   Other Visit Diagnoses       Vaginal atrophy    -  Primary   Relevant Medications   estradiol (ESTRACE VAGINAL) 0.1 MG/GM vaginal cream     Visit for pelvic exam       Relevant Orders   Ambulatory referral to Obstetrics / Gynecology   Cervicovaginal ancillary only     Vaginal burning       Relevant Orders   Cervicovaginal ancillary only       Carie Caddy, CNM  [facility]  05/10/23  5:17 PM

## 2023-05-12 LAB — CERVICOVAGINAL ANCILLARY ONLY
Bacterial Vaginitis (gardnerella): NEGATIVE
Candida Glabrata: NEGATIVE
Candida Vaginitis: NEGATIVE
Chlamydia: NEGATIVE
Comment: NEGATIVE
Comment: NEGATIVE
Comment: NEGATIVE
Comment: NEGATIVE
Comment: NEGATIVE
Comment: NORMAL
Neisseria Gonorrhea: NEGATIVE
Trichomonas: NEGATIVE

## 2023-05-31 ENCOUNTER — Encounter: Payer: Self-pay | Admitting: Nurse Practitioner

## 2023-05-31 ENCOUNTER — Telehealth: Payer: Self-pay | Admitting: Nurse Practitioner

## 2023-05-31 MED ORDER — RIZATRIPTAN BENZOATE 10 MG PO TBDP: 10.0000 mg | ORAL_TABLET | Freq: Every day | ORAL | 1 refills | Status: AC | PRN

## 2023-05-31 NOTE — Telephone Encounter (Signed)
 Refill sent and alerted patient in MyChart

## 2023-05-31 NOTE — Telephone Encounter (Signed)
 Routing to provider to advise.

## 2023-05-31 NOTE — Telephone Encounter (Signed)
 Copied from CRM [id number]. Topic: Clinical - Medication Question >> May 31, 2023 12:31 PM Clayton Bibles wrote: Reason for CRM: Danielle Fuentes wants to know if she can get rizatriptan (MAXALT-MLT) 10 MG disintegrating tablet refilled. I let her know the medication was stopped on 02/17/23. Please send a message through MyChart if she could get the refill. Thanks

## 2023-06-20 ENCOUNTER — Other Ambulatory Visit: Payer: Self-pay | Admitting: Nurse Practitioner

## 2023-06-20 NOTE — Telephone Encounter (Signed)
 Copied from CRM [id number]. Topic: Clinical - Medication Refill >> Jun 20, 2023  9:35 AM Gery Pray wrote: Most Recent Primary Care Visit:  Provider: Aura Dials T  Department: [id number]  Visit Type: OFFICE VISIT  Date: 02/02/2023  Medication: levothyroxine (SYNTHROID) 125 MCG tablet  Has the patient contacted their pharmacy? Yes (Agent: If no, request that the patient contact the pharmacy for the refill. If patient does not wish to contact the pharmacy document the reason why and proceed with request.) (Agent: If yes, when and what did the pharmacy advise?) Stated to contact provider's office  Is this the correct pharmacy for this prescription? Yes If no, delete pharmacy and type the correct one.  This is the patient's preferred pharmacy:  [facility] [address] [place] - [address] [place] [address] Phone: [phone] Fax: [phone]   Has the prescription been filled recently? No  Is the patient out of the medication? No 4 pills remaining   Has the patient been seen for an appointment in the last year OR does the patient have an upcoming appointment? Yes  Can we respond through MyChart? Yes  Agent: Please be advised that Rx refills may take up to 3 business days. We ask that you follow-up with your pharmacy.

## 2023-06-22 MED ORDER — LEVOTHYROXINE SODIUM 125 MCG PO TABS
125.0000 ug | ORAL_TABLET | Freq: Every day | ORAL | 0 refills | Status: DC
Start: 2023-06-22 — End: 2023-08-02

## 2023-06-22 NOTE — Telephone Encounter (Signed)
 Requested medication (s) are due for refill today: yes  Requested medication (s) are on the active medication list: yes  Last refill:  10/02/22  Future visit scheduled: yes  Notes to clinic:  Unable to refill per protocol, last refill by another/historical provider. Routing to PCP for approval.     Requested Prescriptions  Pending Prescriptions Disp Refills   levothyroxine (SYNTHROID) 125 MCG tablet      Sig: Take 1 tablet (125 mcg total) by mouth daily before breakfast.     Endocrinology:  Hypothyroid Agents Failed - 06/22/2023  8:53 AM      Failed - Valid encounter within last 12 months    Recent Outpatient Visits   None     Future Appointments             In 1 month Cannady, Dorie Rank, NP [facility]            Passed - TSH in normal range and within 360 days    TSH  Date Value Ref Range Status  02/02/2023 2.200 0.450 - 4.500 uIU/mL Final

## 2023-07-29 ENCOUNTER — Ambulatory Visit: Payer: Self-pay | Admitting: *Deleted

## 2023-07-29 NOTE — Telephone Encounter (Signed)
  Chief Complaint: New onset dizziness Symptoms: dizziness- unable to walk, thoughts "weird" Frequency: started today Pertinent Negatives: Patient denies fever, chest pain, vomiting, diarrhea, bleeding  Disposition: [x] ED /[] Urgent Care (no appt availability in office) / [] Appointment(In office/virtual)/ []  [place] [place] [facility]/ [] Home Care/ [] Refused Recommended Disposition /[] [facility]/ []  Follow-up with PCP Additional Notes: Patient advised ED- husband will take her   Copied from CRM [id number]. Topic: Clinical - Red Word Triage >> Jul 29, 2023  4:08 PM Elle L wrote: Red Word that prompted transfer to Nurse Triage: The patient's husband states that the patient is experiencing dizziness. Reason for Disposition  SEVERE dizziness (e.g., unable to stand, requires support to walk, feels like passing out now)  Answer Assessment - Initial Assessment Questions 1. DESCRIPTION: "Describe your dizziness."     lightheaded 2. LIGHTHEADED: "Do you feel lightheaded?" (e.g., somewhat faint, woozy, weak upon standing)     Lightheaded, faint, woozy 3. VERTIGO: "Do you feel like either you or the room is spinning or tilting?" (i.e. vertigo)     no 4. SEVERITY: "How bad is it?"  "Do you feel like you are going to faint?" "Can you stand and walk?"   - MILD: Feels slightly dizzy, but walking normally.   - MODERATE: Feels unsteady when walking, but not falling; interferes with normal activities (e.g., school, work).   - SEVERE: Unable to walk without falling, or requires assistance to walk without falling; feels like passing out now.      Moderate/severe 5. ONSET:  "When did the dizziness begin?"     This afternoon 6. AGGRAVATING FACTORS: "Does anything make it worse?" (e.g., standing, change in head position)     Happening without position change 7. HEART RATE: "Can you tell me your heart rate?" "How many beats in 15 seconds?"  (Note: not all patients can do this)       no 8. CAUSE: "What do  you think is causing the dizziness?"     Not sure- "felt weird" 9. RECURRENT SYMPTOM: "Have you had dizziness before?" If Yes, ask: "When was the last time?" "What happened that time?"     no 10. OTHER SYMPTOMS: "Do you have any other symptoms?" (e.g., fever, chest pain, vomiting, diarrhea, bleeding)       Thoughts are "weird"  Protocols used: Dizziness - Lightheadedness-A-AH

## 2023-07-30 NOTE — Patient Instructions (Signed)
 Be Involved in Caring For Your Health:  Taking Medications When medications are taken as directed, they can greatly improve your health. But if they are not taken as prescribed, they may not work. In some cases, not taking them correctly can be harmful. To help ensure your treatment remains effective and safe, understand your medications and how to take them. Bring your medications to each visit for review by your provider.  Your lab results, notes, and after visit summary will be available on My Chart. We strongly encourage you to use this feature. If lab results are abnormal the clinic will contact you with the appropriate steps. If the clinic does not contact you assume the results are satisfactory. You can always view your results on My Chart. If you have questions regarding your health or results, please contact the clinic during office hours. You can also ask questions on My Chart.  We at [facility] are grateful that you chose [place] to provide your care. We strive to provide evidence-based and compassionate care and are always looking for feedback. If you get a survey from the clinic please complete this so we can hear your opinions.  Healthy Eating, Adult Healthy eating may help you get and keep a healthy body weight, reduce the risk of chronic disease, and live a long and productive life. It is important to follow a healthy eating pattern. Your nutritional and calorie needs should be met mainly by different nutrient-rich foods. What are tips for following this plan? Reading food labels Read labels and choose the following: Reduced or low sodium products. Juices with 100% fruit juice. Foods with low saturated fats (<3 g per serving) and high polyunsaturated and monounsaturated fats. Foods with whole grains, such as whole wheat, cracked wheat, brown rice, and wild rice. Whole grains that are fortified with folic acid. This is recommended for females who are pregnant or who want  to become pregnant. Read labels and do not eat or drink the following: Foods or drinks with added sugars. These include foods that contain brown sugar, corn sweetener, corn syrup, dextrose, fructose, glucose, high-fructose corn syrup, honey, invert sugar, lactose, malt syrup, maltose, molasses, raw sugar, sucrose, trehalose, or turbinado sugar. Limit your intake of added sugars to less than 10% of your total daily calories. Do not eat more than the following amounts of added sugar per day: 6 teaspoons (25 g) for females. 9 teaspoons (38 g) for males. Foods that contain processed or refined starches and grains. Refined grain products, such as white flour, degermed cornmeal, white bread, and white rice. Shopping Choose nutrient-rich snacks, such as vegetables, whole fruits, and nuts. Avoid high-calorie and high-sugar snacks, such as potato chips, fruit snacks, and candy. Use oil-based dressings and spreads on foods instead of solid fats such as butter, margarine, sour cream, or cream cheese. Limit pre-made sauces, mixes, and "instant" products such as flavored rice, instant noodles, and ready-made pasta. Try more plant-protein sources, such as tofu, tempeh, black beans, edamame, lentils, nuts, and seeds. Explore eating plans such as the Mediterranean diet or vegetarian diet. Try heart-healthy dips made with beans and healthy fats like hummus and guacamole. Vegetables go great with these. Cooking Use oil to saut or stir-fry foods instead of solid fats such as butter, margarine, or lard. Try baking, boiling, grilling, or broiling instead of frying. Remove the fatty part of meats before cooking. Steam vegetables in water or broth. Meal planning  At meals, imagine dividing your plate into fourths: One-half of  your plate is fruits and vegetables. One-fourth of your plate is whole grains. One-fourth of your plate is protein, especially lean meats, poultry, eggs, tofu, beans, or nuts. Include  low-fat dairy as part of your daily diet. Lifestyle Choose healthy options in all settings, including home, work, school, restaurants, or stores. Prepare your food safely: Wash your hands after handling raw meats. Where you prepare food, keep surfaces clean by regularly washing with hot, soapy water. Keep raw meats separate from ready-to-eat foods, such as fruits and vegetables. Cook seafood, meat, poultry, and eggs to the recommended temperature. Get a food thermometer. Store foods at safe temperatures. In general: Keep cold foods at 76F (4.4C) or below. Keep hot foods at 176F (60C) or above. Keep your freezer at [facility] (-17.8C) or below. Foods are not safe to eat if they have been between the temperatures of 40-176F (4.4-60C) for more than 2 hours. What foods should I eat? Fruits Aim to eat 1-2 cups of fresh, canned (in natural juice), or frozen fruits each day. One cup of fruit equals 1 small apple, 1 large banana, 8 large strawberries, 1 cup (237 g) canned fruit,  cup (82 g) dried fruit, or 1 cup (240 mL) 100% juice. Vegetables Aim to eat 2-4 cups of fresh and frozen vegetables each day, including different varieties and colors. One cup of vegetables equals 1 cup (91 g) broccoli or cauliflower florets, 2 medium carrots, 2 cups (150 g) raw, leafy greens, 1 large tomato, 1 large bell pepper, 1 large sweet potato, or 1 medium white potato. Grains Aim to eat 5-10 ounce-equivalents of whole grains each day. Examples of 1 ounce-equivalent of grains include 1 slice of bread, 1 cup (40 g) ready-to-eat cereal, 3 cups (24 g) popcorn, or  cup (93 g) cooked rice. Meats and other proteins Try to eat 5-7 ounce-equivalents of protein each day. Examples of 1 ounce-equivalent of protein include 1 egg,  oz nuts (12 almonds, 24 pistachios, or 7 walnut halves), 1/4 cup (90 g) cooked beans, 6 tablespoons (90 g) hummus or 1 tablespoon (16 g) peanut butter. A cut of meat or fish that is the size of a deck  of cards is about 3-4 ounce-equivalents (85 g). Of the protein you eat each week, try to have at least 8 sounce (227 g) of seafood. This is about 2 servings per week. This includes salmon, trout, herring, sardines, and anchovies. Dairy Aim to eat 3 cup-equivalents of fat-free or low-fat dairy each day. Examples of 1 cup-equivalent of dairy include 1 cup (240 mL) milk, 8 ounces (250 g) yogurt, 1 ounces (44 g) natural cheese, or 1 cup (240 mL) fortified soy milk. Fats and oils Aim for about 5 teaspoons (21 g) of fats and oils per day. Choose monounsaturated fats, such as canola and olive oils, mayonnaise made with olive oil or avocado oil, avocados, peanut butter, and most nuts, or polyunsaturated fats, such as sunflower, corn, and soybean oils, walnuts, pine nuts, sesame seeds, sunflower seeds, and flaxseed. Beverages Aim for 6 eight-ounce glasses of water per day. Limit coffee to 3-5 eight-ounce cups per day. Limit caffeinated beverages that have added calories, such as soda and energy drinks. If you drink alcohol: Limit how much you have to: 0-1 drink a day if you are female. 0-2 drinks a day if you are female. Know how much alcohol is in your drink. In the [place] one drink is one 12 oz bottle of beer (355 mL), one 5 oz glass of wine (  148 mL), or one 1 oz glass of hard liquor (44 mL). Seasoning and other foods Try not to add too much salt to your food. Try using herbs and spices instead of salt. Try not to add sugar to food. This information is based on [place] nutrition guidelines. To learn more, visit [employer].be. Exact amounts may vary. You may need different amounts. This information is not intended to replace advice given to you by your health care provider. Make sure you discuss any questions you have with your health care provider. Document Revised: 12/07/2021 Document Reviewed: 12/07/2021 Elsevier Patient Education  2024 [employer].

## 2023-07-30 NOTE — Telephone Encounter (Signed)
Noted, agree with this plan of care.

## 2023-08-02 ENCOUNTER — Ambulatory Visit (INDEPENDENT_AMBULATORY_CARE_PROVIDER_SITE_OTHER): Payer: Self-pay | Admitting: Nurse Practitioner

## 2023-08-02 VITALS — BP 117/68 | HR 69 | Temp 98.1°F | Wt 131.8 lb

## 2023-08-02 DIAGNOSIS — E039 Hypothyroidism, unspecified: Secondary | ICD-10-CM

## 2023-08-02 DIAGNOSIS — R7303 Prediabetes: Secondary | ICD-10-CM

## 2023-08-02 DIAGNOSIS — N952 Postmenopausal atrophic vaginitis: Secondary | ICD-10-CM

## 2023-08-02 DIAGNOSIS — R011 Cardiac murmur, unspecified: Secondary | ICD-10-CM

## 2023-08-02 LAB — MICROALBUMIN, URINE WAIVED
Creatinine, Urine Waived: 200 mg/dL (ref 10–300)
Microalb, Ur Waived: 30 mg/L — ABNORMAL HIGH (ref 0–19)
Microalb/Creat Ratio: <30 mg/g (ref <30)

## 2023-08-02 LAB — BAYER DCA HB A1C WAIVED: HB A1C (BAYER DCA - WAIVED): 5.7 % — ABNORMAL HIGH (ref 4.8–5.6)

## 2023-08-02 MED ORDER — ESTRADIOL 0.1 MG/GM VA CREA: 1.0000 | TOPICAL_CREAM | Freq: Every day | VAGINAL | 2 refills | Status: AC

## 2023-08-02 MED ORDER — LEVOTHYROXINE SODIUM 125 MCG PO TABS: 125.0000 ug | ORAL_TABLET | Freq: Every day | ORAL | 1 refills | Status: DC

## 2023-08-02 NOTE — Assessment & Plan Note (Signed)
 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 Monitor closely and obtain echo as needed.

## 2023-08-02 NOTE — Assessment & Plan Note (Signed)
 Chronic, stable.  Recent labs stable.  Continue current dose of Levothyroxine  and adjust as needed.  Plan on thyroid labs at physical.

## 2023-08-02 NOTE — Assessment & Plan Note (Signed)
 Ongoing with A1c trending down today from 6% to 5.7%.  Significant family history of diabetes.  Monitor closely.  She is heavily focused on diet and exercise.

## 2023-08-02 NOTE — Progress Notes (Signed)
 BP 117/68   Pulse 69   Temp 98.1 F (36.7 C) (Oral)   Wt 131 lb 12.8 oz (59.8 kg)   SpO2 98%   BMI 24.11 kg/m    Subjective:    Patient ID: Danielle Fuentes, female    DOB: Jun 18, 1960, 63 y.o.   MRN: [id number]  HPI: Danielle Fuentes is a 63 y.o. female  Chief Complaint  Patient presents with   Hypothyroidism   HYPOTHYROIDISM Taking Levothyroxine  125 MCG daily. Thyroid control status:stable Satisfied with current treatment? yes Medication side effects: no Medication compliance: good compliance Etiology of hypothyroidism: unknown Recent dose adjustment:no Fatigue: yes -- due to job and stress Cold intolerance: yes at baseline Heat intolerance: no Weight gain: no Weight loss: no Constipation: no Diarrhea/loose stools: no Palpitations: no Lower extremity edema: no Anxiety/depressed mood: no    Impaired Fasting Glucose HbA1C:  Lab Results  Component Value Date   HGBA1C 5.7 (H) 08/02/2023  Duration of elevated blood sugar: years Polydipsia: no Polyuria: no Weight change: no Visual disturbance: no Glucose Monitoring: no    Accucheck frequency: Not Checking    Fasting glucose:     Post prandial:  Diabetic Education: Not Completed Family history of diabetes: yes      08/02/2023    4:29 PM 02/02/2023    4:09 PM 11/02/2022    3:58 PM 10/05/2022    9:48 AM  Depression screen PHQ 2/9  Decreased Interest 0 0 0 0  Down, Depressed, Hopeless 0 0 0 0  PHQ - 2 Score 0 0 0 0  Altered sleeping 0 0 0 0  Tired, decreased energy 0 0 0 0  Change in appetite 0 0 0 0  Feeling bad or failure about yourself   0 0 0  Trouble concentrating  0 0 0  Moving slowly or fidgety/restless  0 0 0  Suicidal thoughts  0 0 0  PHQ-9 Score 0 0 0 0  Difficult doing work/chores  Not difficult at all Not difficult at all Not difficult at all       02/02/2023    4:09 PM 11/02/2022    3:59 PM 10/05/2022    9:49 AM  GAD 7 : Generalized Anxiety Score  Nervous, Anxious, on Edge 0 0 0  Control/stop  worrying 0 0 0  Worry too much - different things 0 0 0  Trouble relaxing 0 0 0  Restless 0 0 0  Easily annoyed or irritable 0 0 0  Afraid - awful might happen 0 0 0  Total GAD 7 Score 0 0 0  Anxiety Difficulty Not difficult at all Not difficult at all Not difficult at all   Relevant past medical, surgical, family and social history reviewed and updated as indicated. Interim medical history since our last visit reviewed. Allergies and medications reviewed and updated.  Review of Systems  Constitutional:  Negative for activity change, appetite change, diaphoresis, fatigue and fever.  Respiratory:  Negative for cough, chest tightness and shortness of breath.   Cardiovascular:  Negative for chest pain, palpitations and leg swelling.  Gastrointestinal: Negative.   Neurological: Negative.   Psychiatric/Behavioral: Negative.      Per HPI unless specifically indicated above     Objective:     BP 117/68   Pulse 69   Temp 98.1 F (36.7 C) (Oral)   Wt 131 lb 12.8 oz (59.8 kg)   SpO2 98%   BMI 24.11 kg/m   Wt Readings from Last 3 Encounters:  08/02/23 131 lb 12.8 oz (59.8 kg)  05/10/23 135 lb (61.2 kg)  04/11/23 135 lb (61.2 kg)    Physical Exam Vitals and nursing note reviewed.  Constitutional:      General: She is awake. She is not in acute distress.    Appearance: She is well-developed and well-groomed. She is not ill-appearing or toxic-appearing.  HENT:     Head: Normocephalic.     Right Ear: Hearing and external ear normal.     Left Ear: Hearing and external ear normal.  Eyes:     General: Lids are normal.        Right eye: No discharge.        Left eye: No discharge.     Conjunctiva/sclera: Conjunctivae normal.     Pupils: Pupils are equal, round, and reactive to light.  Neck:     Thyroid: No thyromegaly.     Vascular: No carotid bruit.  Cardiovascular:     Rate and Rhythm: Normal rate and regular rhythm.     Heart sounds: Murmur heard.     Systolic murmur is  present with a grade of 2/6.     No gallop.  Pulmonary:     Effort: Pulmonary effort is normal. No accessory muscle usage or respiratory distress.     Breath sounds: Normal breath sounds.  Abdominal:     General: Bowel sounds are normal. There is no distension.     Palpations: Abdomen is soft.     Tenderness: There is no abdominal tenderness.  Musculoskeletal:     Cervical back: Normal range of motion and neck supple.     Right lower leg: No edema.     Left lower leg: No edema.  Lymphadenopathy:     Cervical: No cervical adenopathy.  Skin:    General: Skin is warm and dry.  Neurological:     Mental Status: She is alert and oriented to person, place, and time.     Deep Tendon Reflexes: Reflexes are normal and symmetric.     Reflex Scores:      Brachioradialis reflexes are 2+ on the right side and 2+ on the left side.      Patellar reflexes are 2+ on the right side and 2+ on the left side. Psychiatric:        Attention and Perception: Attention normal.        Mood and Affect: Mood normal.        Speech: Speech normal.        Behavior: Behavior normal. Behavior is cooperative.        Thought Content: Thought content normal.     Results for orders placed or performed in visit on 08/02/23  Bayer DCA Hb A1c Waived   Collection Time: 08/02/23  4:14 PM  Result Value Ref Range   HB A1C (BAYER DCA - WAIVED) 5.7 (H) 4.8 - 5.6 %  Microalbumin, Urine Waived   Collection Time: 08/02/23  4:14 PM  Result Value Ref Range   Microalb, Ur Waived 30 (H) 0 - 19 mg/L   Creatinine, Urine Waived 200 10 - 300 mg/dL   Microalb/Creat Ratio <30 <30 mg/g      Assessment & Plan:   Problem List Items Addressed This Visit       Endocrine   Hypothyroidism, acquired   Chronic, stable.  Recent labs stable.  Continue current dose of Levothyroxine  and adjust as needed.  Plan on thyroid labs at physical.      Relevant  Medications   levothyroxine  (SYNTHROID ) 125 MCG tablet     Other   Prediabetes  - Primary   Ongoing with A1c trending down today from 6% to 5.7%.  Significant family history of diabetes.  Monitor closely.  She is heavily focused on diet and exercise.        Relevant Orders   Bayer DCA Hb A1c Waived (Completed)   Microalbumin, Urine Waived (Completed)   Comprehensive metabolic panel with GFR   Heart murmur   Systolic with no symptoms. Monitor closely and obtain echo as needed.      Other Visit Diagnoses       Vaginal atrophy       Refills sent on estrace  cream.   Relevant Medications   estradiol  (ESTRACE  VAGINAL) 0.1 MG/GM vaginal cream        Follow up plan: Return in about 6 months (around 02/02/2024) for Annual Physical -- after 02/02/24 + lab visit early morning of physical day.

## 2023-08-03 ENCOUNTER — Ambulatory Visit: Payer: Self-pay | Admitting: Nurse Practitioner

## 2023-08-03 LAB — COMPREHENSIVE METABOLIC PANEL WITH GFR
ALT: 20 IU/L (ref 0–32)
AST: 20 IU/L (ref 0–40)
Albumin: 4.4 g/dL (ref 3.8–4.9)
Alkaline Phosphatase: 66 IU/L (ref 44–121)
BUN/Creatinine Ratio: 20 (ref 12–28)
BUN: 16 mg/dL (ref 8–27)
Bilirubin Total: 0.2 mg/dL (ref 0.0–1.2)
CO2: 24 mmol/L (ref 20–29)
Calcium: 9.4 mg/dL (ref 8.7–10.3)
Chloride: 103 mmol/L (ref 96–106)
Creatinine, Ser: 0.81 mg/dL (ref 0.57–1.00)
Globulin, Total: 2.1 g/dL (ref 1.5–4.5)
Glucose: 83 mg/dL (ref 70–99)
Potassium: 4.2 mmol/L (ref 3.5–5.2)
Sodium: 141 mmol/L (ref 134–144)
Total Protein: 6.5 g/dL (ref 6.0–8.5)
eGFR: 83 mL/min/{1.73_m2} (ref >59)

## 2023-08-03 NOTE — Progress Notes (Signed)
 Contacted via MyChart   Good afternoon Danielle Fuentes, your labs have returned and overall remain stable.  Great news!!  No changes needed.  Any questions? Keep being amazing!!  Thank you for allowing me to participate in your care.  I appreciate you. Kindest regards, Jojo Geving

## 2023-09-09 ENCOUNTER — Other Ambulatory Visit: Payer: Self-pay | Admitting: Nurse Practitioner

## 2023-09-09 NOTE — Telephone Encounter (Unsigned)
 Copied from CRM [id number]. Topic: Clinical - Medication Refill >> Sep 09, 2023  1:08 PM Star East wrote: Medication: levothyroxine  (SYNTHROID ) 125 MCG tablet  Has the patient contacted their pharmacy? No (Agent: If no, request that the patient contact the pharmacy for the refill. If patient does not wish to contact the pharmacy document the reason why and proceed with request.) (Agent: If yes, when and what did the pharmacy advise?)  This is the patient's preferred pharmacy:  [facility] [address], [place] - [address] [place] [address] Phone: [phone] Fax: [phone]  Is this the correct pharmacy for this prescription? Yes If no, delete pharmacy and type the correct one.   Has the prescription been filled recently? Yes  Is the patient out of the medication? No  Has the patient been seen for an appointment in the last year OR does the patient have an upcoming appointment? Yes  Can we respond through MyChart? Yes  Agent: Please be advised that Rx refills may take up to 3 business days. We ask that you follow-up with your pharmacy.

## 2023-09-13 NOTE — Telephone Encounter (Signed)
 Too soon for refill, refill 08/02/23 for 90 and 1 RF.  Requested Prescriptions  Pending Prescriptions Disp Refills   levothyroxine  (SYNTHROID ) 125 MCG tablet 90 tablet 1    Sig: Take 1 tablet (125 mcg total) by mouth daily before breakfast.     Endocrinology:  Hypothyroid Agents Passed - 09/13/2023 10:27 AM      Passed - TSH in normal range and within 360 days    TSH  Date Value Ref Range Status  02/02/2023 2.200 0.450 - 4.500 uIU/mL Final         Passed - Valid encounter within last 12 months    Recent Outpatient Visits           1 month ago Prediabetes   [facility] [place], Melanie DASEN, NP

## 2023-11-11 ENCOUNTER — Ambulatory Visit (INDEPENDENT_AMBULATORY_CARE_PROVIDER_SITE_OTHER): Payer: Self-pay | Admitting: Pediatrics

## 2023-11-11 ENCOUNTER — Encounter: Payer: Self-pay | Admitting: Pediatrics

## 2023-11-11 VITALS — BP 133/80 | HR 65 | Temp 98.1°F

## 2023-11-11 DIAGNOSIS — R052 Subacute cough: Secondary | ICD-10-CM

## 2023-11-11 NOTE — Progress Notes (Signed)
 Office Visit  BP 133/80   Pulse 65   Temp 98.1 F (36.7 C) (Oral)   SpO2 97%    Subjective:    Patient ID: Danielle Fuentes, female    DOB: Oct 01, 1960, 63 y.o.   MRN: [id number]  HPI: Danielle Fuentes is a 63 y.o. female  Chief Complaint  Patient presents with   Cough    Been sick for the past few weeks sore throat and cough    Discussed the use of AI scribe software for clinical note transcription with the patient, who gave verbal consent to proceed.  History of Present Illness   Danielle Fuentes is a 63 year old female who presents with a persistent sore throat and cough.  She has been experiencing a sore throat that began several weeks ago, initially accompanied by cold-like symptoms. The sore throat was severe at first, requiring the use of cough medicine and pain relief to manage symptoms and continue working. She purchased Delsym for the cough and used Peridex (chlorhexidine) mouthwash from her workplace, which has helped improve her symptoms. The sore throat has since improved, but she continues to experience a persistent cough.  She experienced fatigue and possibly a fever at the onset, though she has not had a fever since. No recent exposure to children or other potential sources of infection, though her husband has a type of streptococcus infection unrelated to her symptoms. No current sore throat pain, but the cough persists. She also experienced ear pain that felt connected to her throat, possibly due to post-nasal drip.  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 She has been managing her symptoms to avoid leaving her coworker alone with a heavy workload. She has not sought urgent care due to cost concerns. She is responsible for taking care of her father, husband, and mother, which adds to her stress and impacts her ability to rest adequately. She tries to get enough sleep despite the stress and busy schedule.      Relevant past medical, surgical,  family and social history reviewed and updated as indicated. Interim medical history since our last visit reviewed. Allergies and medications reviewed and updated.  ROS per HPI unless specifically indicated above     Objective:    BP 133/80   Pulse 65   Temp 98.1 F (36.7 C) (Oral)   SpO2 97%   Wt Readings from Last 3 Encounters:  08/02/23 131 lb 12.8 oz (59.8 kg)  05/10/23 135 lb (61.2 kg)  04/11/23 135 lb (61.2 kg)     Physical Exam Constitutional:      Appearance: Normal appearance. She is not ill-appearing or toxic-appearing.  HENT:     Right Ear: Tympanic membrane normal.     Left Ear: Tympanic membrane normal.     Nose: No congestion or rhinorrhea.     Mouth/Throat:     Pharynx: No oropharyngeal exudate or posterior oropharyngeal erythema.  Eyes:     Pupils: Pupils are equal, round, and reactive to light.  Cardiovascular:     Rate and Rhythm: Normal rate and regular rhythm.     Pulses: Normal pulses.     Heart sounds: Normal heart sounds.  Pulmonary:     Effort: Pulmonary effort is normal.     Breath sounds: No wheezing or rhonchi.  Musculoskeletal:        General: Normal range of motion.  Skin:    Comments: Normal skin color  Neurological:  General: No focal deficit present.     Mental Status: She is alert. Mental status is at baseline.  Psychiatric:        Mood and Affect: Mood normal.        Behavior: Behavior normal.        Thought Content: Thought content normal.         11/11/2023    9:11 AM 08/02/2023    4:29 PM 02/02/2023    4:09 PM 11/02/2022    3:58 PM 10/05/2022    9:48 AM  Depression screen PHQ 2/9  Decreased Interest 0 0 0 0 0  Down, Depressed, Hopeless 0 0 0 0 0  PHQ - 2 Score 0 0 0 0 0  Altered sleeping 0 0 0 0 0  Tired, decreased energy 0 0 0 0 0  Change in appetite 0 0 0 0 0  Feeling bad or failure about yourself  0  0 0 0  Trouble concentrating 0  0 0 0  Moving slowly or fidgety/restless 0  0 0 0  Suicidal thoughts 0  0 0 0   PHQ-9 Score 0 0 0 0 0  Difficult doing work/chores Not difficult at all  Not difficult at all Not difficult at all Not difficult at all       11/11/2023    9:11 AM 02/02/2023    4:09 PM 11/02/2022    3:59 PM 10/05/2022    9:49 AM  GAD 7 : Generalized Anxiety Score  Nervous, Anxious, on Edge 0 0 0 0  Control/stop worrying 0 0 0 0  Worry too much - different things 0 0 0 0  Trouble relaxing 0 0 0 0  Restless 0 0 0 0  Easily annoyed or irritable 0 0 0 0  Afraid - awful might happen 0 0 0 0  Total GAD 7 Score 0 0 0 0  Anxiety Difficulty Not difficult at all Not difficult at all Not difficult at all Not difficult at all       Assessment & Plan:  Assessment & Plan   Subacute cough Persistent cough likely due to viral pharyngitis. Throat shows mild redness, no plaques, overall picture suggesting viral over bacterial cause. Normal exam. Pt given strict return precautions.  - Continue Delsym for cough. - Continue chlorhexidine gargles as needed. - Consider strep swab if symptoms persist or worsen. - Order chest X-ray if cough persists beyond four to six weeks. - Provided after visit summary with imaging center address. -     DG Chest 2 View; Future   Follow up plan: No follow-ups on file.  Hadassah SHAUNNA Nett, MD

## 2023-11-11 NOTE — Patient Instructions (Signed)
 [facility] [address] [place],  [place]  [address]

## 2024-02-06 ENCOUNTER — Other Ambulatory Visit: Payer: Self-pay | Admitting: Nurse Practitioner

## 2024-02-06 ENCOUNTER — Encounter: Payer: Self-pay | Admitting: Nurse Practitioner

## 2024-02-06 ENCOUNTER — Other Ambulatory Visit: Payer: Self-pay

## 2024-02-06 MED ORDER — LEVOTHYROXINE SODIUM 125 MCG PO TABS: 125.0000 ug | ORAL_TABLET | Freq: Every day | ORAL | 1 refills | Status: DC

## 2024-02-06 NOTE — Telephone Encounter (Signed)
 Prescription Request  02/06/2024  LOV: 08/02/2023  What is the name of the medication or equipment? levothyroxine  (SYNTHROID ) 125 MCG tablet   Have you contacted your pharmacy to request a refill? No   Which pharmacy would you like this sent to?  [facility] [address] [place] - [address] [place] [address] Phone: [phone] Fax: [phone]    Patient notified that their request is being sent to the clinical staff for review and that they should receive a response within 2 business days.   Please advise at Mobile [phone] (mobile)

## 2024-03-10 DIAGNOSIS — E7800 Pure hypercholesterolemia, unspecified: Secondary | ICD-10-CM | POA: Insufficient documentation

## 2024-03-10 NOTE — Patient Instructions (Incomplete)
 Please call to schedule your mammogram and/or bone density: [facility]  Address: [address], [place], [place] [address] Phone: [phone]  [facility] [address] [place],  [place]  [address] Phone: [phone]    Prediabetes: What to Know Prediabetes is when your blood sugar, also called glucose, is at a higher level than normal but not high enough for you to be diagnosed with type 2 diabetes (type 2 diabetes mellitus). Having prediabetes puts you at risk for getting type 2 diabetes. By making some healthy changes, you may be able to prevent or delay getting type 2 diabetes. This is important because type 2 diabetes can lead to serious problems. Some of these include: Heart disease. Stroke. Blindness. Kidney disease. Depression. Poor blood flow in the feet and legs. In very bad cases, this could lead to having a leg removed by surgery (amputation). What are the causes? The exact cause of prediabetes isn't known. It may result from insulin resistance. Insulin resistance happens when cells in the body don't respond properly to insulin that the body makes. This can cause too much sugar to build up in the blood. High blood sugar, also called hyperglycemia, can develop. What increases the risk? Having a family member with type 2 diabetes. Being older than 61 years of age. Having had a temporary form of diabetes during a pregnancy. This is called gestational diabetes. Having had polycystic ovary syndrome (PCOS). Being overweight or obese. Being inactive and not getting much exercise. Having a history of heart disease. This may include problems with cholesterol levels, high levels of blood fats, or high blood pressure. What are the signs or symptoms? You may have no symptoms. If you do have symptoms, they may include: Increased hunger. Increased thirst. Needing to pee more often. Changes in how you  see, like blurry vision. Feeling tired. How is this diagnosed? Prediabetes can be diagnosed with blood tests that check your blood sugar. One or more of these tests may be done: A fasting blood glucose (FBG) test. You won't be allowed to eat (you will fast) for at least 8 hours before a blood sample is taken. An A1C blood test, also called a hemoglobin A1C test. This test shows information about blood sugar levels over the past 2?3 months. An oral glucose tolerance test (OGTT). This test measures your blood sugar at two points in time: After you haven't eaten for a while. This is your baseline level. Two hours after you drink a beverage that has sugar in it. You may be diagnosed with prediabetes if: Your FBG is 100?125 mg/dL (4.3-3.0 mmol/L). Your A1C level is 5.7?6.4% (39-46 mmol/mol). Your OGTT result is 140?199 mg/dL (2.1-88 mmol/L). These blood tests may need to be done again to be sure of the diagnosis. How is this treated? Treatment may include making changes to your diet and lifestyle. These changes can help lower your blood sugar and keep you from getting type 2 diabetes. In some cases, medicine may be given to help lower your risk. Follow these instructions at home: Eating and drinking  Eat and drink as told. Follow a healthy meal plan. This includes eating lean proteins, whole grains, legumes, fresh fruits and vegetables, low-fat dairy products, and healthy fats. Meet with an expert in healthy eating called a dietitian. This person can help create a healthy eating plan that's right for you. Lifestyle Do moderate-intensity exercise. Do this for at least 30 minutes  a day on 5 or more days each week, or as told by your health care provider. A mix of activities may be best. Good choices include brisk walking, swimming, biking, and weight lifting. Try to lose weight if your provider says it's OK. Losing 5-7% of your body weight can help reverse insulin resistance. Do not drink alcohol  if: Your provider tells you not to drink. You're pregnant, may be pregnant, or plan to become pregnant. If you drink alcohol: Limit how much you have to: 0-1 drink a day if you're female. 0-2 drinks a day if you're female. Know how much alcohol is in your drink. In the [place] one drink is one 12 oz bottle of beer (355 mL), one 5 oz glass of wine (148 mL), or one 1 oz glass of hard liquor (44 mL). General instructions Take medicines only as told. You may be given medicines that help lower the risk of type 2 diabetes. Do not smoke, vape, or use nicotine  or tobacco. Where to find more information American Diabetes Association: diabetes.org/about-diabetes/prediabetes Academy of Nutrition and Dietetics: eatright.org American Heart Association: Go to thisjobs.cz. Click the search icon. Type prediabetes in the search box. Contact a health care provider if: You have any of these symptoms: Increased hunger. Peeing more often than usual. Increased thirst. Feeling tired. Changes in how you see, like blurry vision. Feeling like you may throw up. Throwing up. Get help right away if: You have shortness of breath. You feel confused. This information is not intended to replace advice given to you by your health care provider. Make sure you discuss any questions you have with your health care provider. Document Revised: 10/10/2022 Document Reviewed: 10/10/2022 Elsevier Patient Education  2024 [employer].

## 2024-03-12 ENCOUNTER — Other Ambulatory Visit: Payer: Self-pay

## 2024-03-12 ENCOUNTER — Encounter: Payer: Self-pay | Admitting: Nurse Practitioner

## 2024-03-12 ENCOUNTER — Ambulatory Visit: Payer: Self-pay | Admitting: Nurse Practitioner

## 2024-03-12 DIAGNOSIS — R011 Cardiac murmur, unspecified: Secondary | ICD-10-CM

## 2024-03-12 DIAGNOSIS — Z23 Encounter for immunization: Secondary | ICD-10-CM

## 2024-03-12 DIAGNOSIS — Z1231 Encounter for screening mammogram for malignant neoplasm of breast: Secondary | ICD-10-CM

## 2024-03-12 DIAGNOSIS — E039 Hypothyroidism, unspecified: Secondary | ICD-10-CM

## 2024-03-12 DIAGNOSIS — G43109 Migraine with aura, not intractable, without status migrainosus: Secondary | ICD-10-CM

## 2024-03-12 DIAGNOSIS — R7303 Prediabetes: Secondary | ICD-10-CM

## 2024-03-12 DIAGNOSIS — E7800 Pure hypercholesterolemia, unspecified: Secondary | ICD-10-CM

## 2024-03-12 DIAGNOSIS — Z0000 Encounter for general adult medical examination without abnormal findings: Secondary | ICD-10-CM

## 2024-03-12 DIAGNOSIS — K5909 Other constipation: Secondary | ICD-10-CM

## 2024-03-12 DIAGNOSIS — Z13220 Encounter for screening for lipoid disorders: Secondary | ICD-10-CM

## 2024-03-12 LAB — BAYER DCA HB A1C WAIVED: HB A1C (BAYER DCA - WAIVED): 5.8 % — ABNORMAL HIGH (ref 4.8–5.6)

## 2024-03-12 NOTE — Progress Notes (Unsigned)
 or

## 2024-03-13 LAB — COMPREHENSIVE METABOLIC PANEL WITH GFR
ALT: 37 IU/L — ABNORMAL HIGH (ref 0–32)
AST: 37 IU/L (ref 0–40)
Albumin: 4.1 g/dL (ref 3.9–4.9)
Alkaline Phosphatase: 72 IU/L (ref 49–135)
BUN/Creatinine Ratio: 22 (ref 12–28)
BUN: 18 mg/dL (ref 8–27)
Bilirubin Total: 0.2 mg/dL (ref 0.0–1.2)
CO2: 24 mmol/L (ref 20–29)
Calcium: 8.8 mg/dL (ref 8.7–10.3)
Chloride: 104 mmol/L (ref 96–106)
Creatinine, Ser: 0.83 mg/dL (ref 0.57–1.00)
Globulin, Total: 2.2 g/dL (ref 1.5–4.5)
Glucose: 107 mg/dL — ABNORMAL HIGH (ref 70–99)
Potassium: 4.1 mmol/L (ref 3.5–5.2)
Sodium: 142 mmol/L (ref 134–144)
Total Protein: 6.3 g/dL (ref 6.0–8.5)
eGFR: 80 mL/min/1.73

## 2024-03-13 LAB — CBC WITH DIFFERENTIAL/PLATELET
Basophils Absolute: 0 x10E3/uL (ref 0.0–0.2)
Basos: 1 %
EOS (ABSOLUTE): 0.1 x10E3/uL (ref 0.0–0.4)
Eos: 2 %
Hematocrit: 42.4 % (ref 34.0–46.6)
Hemoglobin: 13.6 g/dL (ref 11.1–15.9)
Immature Grans (Abs): 0 x10E3/uL (ref 0.0–0.1)
Immature Granulocytes: 0 %
Lymphocytes Absolute: 1.8 x10E3/uL (ref 0.7–3.1)
Lymphs: 41 %
MCH: 29.2 pg (ref 26.6–33.0)
MCHC: 32.1 g/dL (ref 31.5–35.7)
MCV: 91 fL (ref 79–97)
Monocytes Absolute: 0.3 x10E3/uL (ref 0.1–0.9)
Monocytes: 6 %
Neutrophils Absolute: 2.2 x10E3/uL (ref 1.4–7.0)
Neutrophils: 50 %
Platelets: 175 x10E3/uL (ref 150–450)
RBC: 4.66 x10E6/uL (ref 3.77–5.28)
RDW: 12.4 % (ref 11.7–15.4)
WBC: 4.4 x10E3/uL (ref 3.4–10.8)

## 2024-03-13 LAB — TSH: TSH: 7.44 u[IU]/mL — ABNORMAL HIGH (ref 0.450–4.500)

## 2024-03-13 LAB — LIPID PANEL W/O CHOL/HDL RATIO
Cholesterol, Total: 182 mg/dL (ref 100–199)
HDL: 54 mg/dL
LDL Chol Calc (NIH): 116 mg/dL — ABNORMAL HIGH (ref 0–99)
Triglycerides: 63 mg/dL (ref 0–149)
VLDL Cholesterol Cal: 12 mg/dL (ref 5–40)

## 2024-03-13 LAB — T4, FREE: Free T4: 1.55 ng/dL (ref 0.82–1.77)

## 2024-03-13 NOTE — Progress Notes (Signed)
 Contacted via MyChart -- she came in for labs early yesterday but then did not attend her afternoon visit -- needs visit scheduled to discuss results and recheck thyroid labs, no labs ahead of time anymore -- she will have at her visit. Thanks.  Good evening Danielle Fuentes, your labs have returned but I did notice you came in early for labs but then missed visit yesterday. I do need you to schedule a visit so we can go over lab results and also recheck thyroid lab, my staff will be reaching out to schedule. This is important since the last time I saw you it was for a cough in August, this recent visit was meant to be your physical exam.  - We do need to recheck thyroid since TSH is elevated, more hypothyroid. My staff will be calling to schedule a visit with me.  Thank you. Have a Merry Christmas. Keep being stellar!!  Thank you for allowing me to participate in your care.  I appreciate you. Kindest regards, Simren Popson

## 2024-03-19 ENCOUNTER — Other Ambulatory Visit: Payer: Self-pay | Admitting: Nurse Practitioner

## 2024-03-19 NOTE — Telephone Encounter (Unsigned)
 Copied from CRM #[id number]. Topic: Clinical - Medication Refill >> Mar 19, 2024 11:38 AM Kevelyn M wrote: Medication: levothyroxine  (SYNTHROID ) 125 MCG tablet  Has the patient contacted their pharmacy? No, patient doesn't have access to her pill bottle to check refills. (Agent: If no, request that the patient contact the pharmacy for the refill. If patient does not wish to contact the pharmacy document the reason why and proceed with request.) (Agent: If yes, when and what did the pharmacy advise?)  This is the patient's preferred pharmacy:  [facility] [address] [place] - [address] [place] [address] Phone: [phone] Fax: [phone]  Is this the correct pharmacy for this prescription? Yes If no, delete pharmacy and type the correct one.   Has the prescription been filled recently? No  Is the patient out of the medication? Yes  Has the patient been seen for an appointment in the last year OR does the patient have an upcoming appointment? Yes  Can we respond through MyChart? Yes  Agent: Please be advised that Rx refills may take up to 3 business days. We ask that you follow-up with your pharmacy.

## 2024-03-20 NOTE — Telephone Encounter (Signed)
 Too soon for refill.  Requested Prescriptions  Pending Prescriptions Disp Refills   levothyroxine  (SYNTHROID ) 125 MCG tablet 90 tablet 1    Sig: Take 1 tablet (125 mcg total) by mouth daily before breakfast.     Endocrinology:  Hypothyroid Agents Failed - 03/20/2024  3:50 PM      Failed - TSH in normal range and within 360 days    TSH  Date Value Ref Range Status  03/12/2024 7.440 (H) 0.450 - 4.500 uIU/mL Final         Passed - Valid encounter within last 12 months    Recent Outpatient Visits           4 months ago Subacute cough   [facility] [place] Hadassah SQUIBB, MD   7 months ago Prediabetes   [facility] [place], Melanie DASEN, NP

## 2024-03-27 MED ORDER — LEVOTHYROXINE SODIUM 150 MCG PO TABS: 150.0000 ug | ORAL_TABLET | Freq: Every day | ORAL | 3 refills | Status: AC

## 2024-04-07 NOTE — Patient Instructions (Signed)

## 2024-04-09 ENCOUNTER — Encounter: Payer: Self-pay | Admitting: Nurse Practitioner

## 2024-04-09 ENCOUNTER — Ambulatory Visit (INDEPENDENT_AMBULATORY_CARE_PROVIDER_SITE_OTHER): Payer: Self-pay | Admitting: Nurse Practitioner

## 2024-04-09 VITALS — BP 120/76 | HR 73 | Temp 97.9°F | Resp 16 | Ht 62.01 in | Wt 135.0 lb

## 2024-04-09 DIAGNOSIS — Z0000 Encounter for general adult medical examination without abnormal findings: Secondary | ICD-10-CM

## 2024-04-09 DIAGNOSIS — G43109 Migraine with aura, not intractable, without status migrainosus: Secondary | ICD-10-CM

## 2024-04-09 DIAGNOSIS — R7303 Prediabetes: Secondary | ICD-10-CM

## 2024-04-09 DIAGNOSIS — R011 Cardiac murmur, unspecified: Secondary | ICD-10-CM

## 2024-04-09 DIAGNOSIS — E039 Hypothyroidism, unspecified: Secondary | ICD-10-CM

## 2024-04-09 NOTE — Assessment & Plan Note (Signed)
 Chronic, stable.  Recent dose changes on 03/27/24.  Continue current dose of Levothyroxine  and adjust as needed.  Plan on thyroid labs repeat around 05/08/24.

## 2024-04-09 NOTE — Assessment & Plan Note (Signed)
 Chronic, stable.  Continue Maxalt  PRN and if any worsening discussed with her starting preventative, which she has never taken.  Could consider Amitriptyline or Topamax.  Would avoid BB due to lower resting HR at baseline.

## 2024-04-09 NOTE — Assessment & Plan Note (Signed)
 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 Monitor closely and obtain echo as needed.

## 2024-04-09 NOTE — Assessment & Plan Note (Signed)
 Ongoing with A1c recently 5.8%.  Significant family history of diabetes.  Monitor closely.  She is heavily focused on diet and exercise.

## 2024-04-09 NOTE — Progress Notes (Signed)
 "  BP 120/76 (BP Location: Left Arm, Patient Position: Sitting, Cuff Size: Normal)   Pulse 73   Temp 97.9 F (36.6 C) (Oral)   Resp 16   Ht 5' 2.01 (1.575 m)   Wt 135 lb (61.2 kg)   SpO2 95%   BMI 24.69 kg/m    Subjective:    Patient ID: Danielle Fuentes, female    DOB: 28-Jun-1960, 64 y.o.   MRN: [id number]  HPI: Danielle Fuentes is a 64 y.o. female presenting on 04/09/2024 for comprehensive medical examination. Current medical complaints include:none  She currently lives with: husband Menopausal Symptoms: no  HYPOTHYROIDISM Takes Levothyroxine  150 MCG daily. Thyroid control status:stable Satisfied with current treatment? yes Medication side effects: no Medication compliance: good compliance Etiology of hypothyroidism: unknown Recent dose adjustment: yes Fatigue: yes -- even with increase dose Cold intolerance: yes  Heat intolerance: no Weight gain: no Weight loss: no Constipation: yes  Diarrhea/loose stools: no Palpitations: no Lower extremity edema: no Anxiety/depressed mood: no    Impaired Fasting Glucose HbA1C:  Lab Results  Component Value Date   HGBA1C 5.8 (H) 03/12/2024  Duration of elevated blood sugar: chronic Polydipsia: no Polyuria: no Weight change: no Visual disturbance: no Glucose Monitoring: no    Accucheck frequency: Not Checking    Fasting glucose:     Post prandial:  Diabetic Education: Not Completed Family history of diabetes: yes mother, father, and brother The 10-year ASCVD risk score (Arnett DK, et al., 2019) is: 3.1%   Values used to calculate the score:     Age: 34 years     Clinically relevant sex: Female     Is Non-Hispanic African American: No     Diabetic: No     Tobacco smoker: No     Systolic Blood Pressure: 120 mmHg     Is BP treated: No     HDL Cholesterol: 54 mg/dL     Total Cholesterol: 182 mg/dL  MIGRAINES Takes Maxalt  as needed. 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 Currently a lot better, even with stressors on board. Has not had  one in 1-2 months. Duration: chronic Quality: dull, aching, and throbbing Frequency: intermittent Location: over right eye Headache duration: 3 days in a row when present Radiation: no Time of day headache occurs: varies Alleviating factors: Maxalt  Aggravating factors: stress and atmosphere changes Headache status at time of visit: asymptomatic Treatments attempted: rest and triptans   Aura: yes Nausea:  yes Vomiting: no Photophobia:  no Phonophobia:  no Effect on social functioning:  no Numbers of missed days of school/work each month: 0 Confusion:  no Gait disturbance/ataxia:  no Behavioral changes:  no Fevers:  no   Functional Status Survey: Is the patient deaf or have difficulty hearing?: No Does the patient have difficulty seeing, even when wearing glasses/contacts?: No Does the patient have difficulty concentrating, remembering, or making decisions?: No Does the patient have difficulty walking or climbing stairs?: No Does the patient have difficulty dressing or bathing?: No Does the patient have difficulty doing errands alone such as visiting a doctor's office or shopping?: No    Depression Screen done today and results listed below:     04/09/2024    8:04 AM 11/11/2023    9:11 AM 08/02/2023    4:29 PM 02/02/2023    4:09 PM 11/02/2022    3:58 PM  Depression screen PHQ 2/9  Decreased Interest 0 0 0 0 0  Down, Depressed, Hopeless 0 0 0 0 0  PHQ -  2 Score 0 0 0 0 0  Altered sleeping 0 0 0 0 0  Tired, decreased energy 0 0 0 0 0  Change in appetite 0 0 0 0 0  Feeling bad or failure about yourself  0 0  0 0  Trouble concentrating 0 0  0 0  Moving slowly or fidgety/restless 0 0  0 0  Suicidal thoughts 0 0  0 0  PHQ-9 Score 0 0  0  0  0   Difficult doing work/chores Not difficult at all Not difficult at all  Not difficult at all Not difficult at all     Data saved with a previous flowsheet row definition      04/09/2024    8:04 AM 11/11/2023    9:11 AM 02/02/2023     4:09 PM 11/02/2022    3:59 PM  GAD 7 : Generalized Anxiety Score  Nervous, Anxious, on Edge 0 0 0 0  Control/stop worrying 0 0 0 0  Worry too much - different things 0 0 0 0  Trouble relaxing 0 0 0 0  Restless 0 0 0 0  Easily annoyed or irritable 0 0 0 0  Afraid - awful might happen 0 0 0 0  Total GAD 7 Score 0 0 0 0  Anxiety Difficulty Not difficult at all Not difficult at all Not difficult at all Not difficult at all      10/05/2022    9:48 AM 11/02/2022    3:58 PM 02/02/2023    4:09 PM 11/11/2023    9:11 AM 04/09/2024    8:04 AM  Fall Risk  Falls in the past year? 0 0 0 0 0  Was there an injury with Fall? 0  0  0  0  0  Fall Risk Category Calculator 0 0 0 0 0  Patient at Risk for Falls Due to No Fall Risks No Fall Risks No Fall Risks No Fall Risks No Fall Risks  Fall risk Follow up  Falls evaluation completed Falls evaluation completed Falls evaluation completed Falls evaluation completed     Data saved with a previous flowsheet row definition    Past Medical History:  Past Medical History:  Diagnosis Date   Allergy    Arthritis    Asthma    Heart murmur    Prediabetes    Thyroid disease     Surgical History:  Past Surgical History:  Procedure Laterality Date   APPENDECTOMY      Medications:  Current Outpatient Medications on File Prior to Visit  Medication Sig   estradiol  (ESTRACE  VAGINAL) 0.1 MG/GM vaginal cream Place 1 Applicatorful vaginally at bedtime.   levothyroxine  (SYNTHROID ) 150 MCG tablet Take 1 tablet (150 mcg total) by mouth daily.   loratadine (CLARITIN) 10 MG tablet Take 10 mg by mouth daily.   rizatriptan  (MAXALT -MLT) 10 MG disintegrating tablet Take 1 tablet (10 mg total) by mouth daily as needed for migraine. May repeat after 2 hours with maximum of 3 doses in 24 hours and 12 dose per month.   No current facility-administered medications on file prior to visit.    Allergies:  No Known Allergies  Social History:  Social History    Socioeconomic History   Marital status: Married    Spouse name: Not on file   Number of children: Not on file   Years of education: Not on file   Highest education level: GED or equivalent  Occupational History   Not on file  Tobacco Use   Smoking status: Former    Current packs/day: 0.00    Types: Cigarettes    Quit date: 2002    Years since quitting: 24.0   Smokeless tobacco: Never  Vaping Use   Vaping status: Never Used  Substance and Sexual Activity   Alcohol use: Not Currently   Drug use: Never   Sexual activity: Yes    Birth control/protection: None  Other Topics Concern   Not on file  Social History Narrative   Not on file   Social Drivers of Health   Tobacco Use: Medium Risk (04/09/2024)   Patient History    Smoking Tobacco Use: Former    Smokeless Tobacco Use: Never    Passive Exposure: Not on Actuary Strain: Low Risk (04/06/2024)   Overall Financial Resource Strain (CARDIA)    Difficulty of Paying Living Expenses: Not very hard  Food Insecurity: No Food Insecurity (04/06/2024)   Epic    Worried About Programme Researcher, Broadcasting/film/video in the Last Year: Never true    Ran Out of Food in the Last Year: Never true  Transportation Needs: No Transportation Needs (04/06/2024)   Epic    Lack of Transportation (Medical): No    Lack of Transportation (Non-Medical): No  Physical Activity: Unknown (04/06/2024)   Exercise Vital Sign    Days of Exercise per Week: Patient declined    Minutes of Exercise per Session: Not on file  Stress: Patient Declined (04/06/2024)   [employer] of Occupational Health - Occupational Stress Questionnaire    Feeling of Stress: Patient declined  Social Connections: Socially Integrated (04/06/2024)   Social Connection and Isolation Panel    Frequency of Communication with Friends and Family: More than three times a week    Frequency of Social Gatherings with Friends and Family: Patient declined    Attends Religious Services: More  than 4 times per year    Active Member of [employer] or Organizations: Yes    Attends Banker Meetings: More than 4 times per year    Marital Status: Married  Catering Manager Violence: Not At Risk (10/05/2022)   Humiliation, Afraid, Rape, and Kick questionnaire    Fear of Current or Ex-Partner: No    Emotionally Abused: No    Physically Abused: No    Sexually Abused: No  Depression (PHQ2-9): Low Risk (04/09/2024)   Depression (PHQ2-9)    PHQ-2 Score: 0  Alcohol Screen: Low Risk (04/06/2024)   Alcohol Screen    Last Alcohol Screening Score (AUDIT): 1  Housing: Unknown (04/06/2024)   Epic    Unable to Pay for Housing in the Last Year: No    Number of Times Moved in the Last Year: Not on file    Homeless in the Last Year: No  Utilities: Not At Risk (10/05/2022)   [facility] Utilities    Threatened with loss of utilities: No  Health Literacy: Adequate Health Literacy (10/05/2022)   B1300 Health Literacy    Frequency of need for help with medical instructions: Never   Social History   Tobacco Use  Smoking Status Former   Current packs/day: 0.00   Types: Cigarettes   Quit date: 2002   Years since quitting: 24.0  Smokeless Tobacco Never   Social History   Substance and Sexual Activity  Alcohol Use Not Currently    Family History:  Family History  Problem Relation Age of Onset   Diabetes Mother    Hyperlipidemia Mother    Heart  disease Father    Diabetes Father    Kidney failure Father    Diabetes Brother    Diabetes Maternal Grandfather    Heart disease Paternal Grandmother     Past medical history, surgical history, medications, allergies, family history and social history reviewed with patient today and changes made to appropriate areas of the chart.   ROS All other ROS negative except what is listed above and in the HPI.      Objective:    BP 120/76 (BP Location: Left Arm, Patient Position: Sitting, Cuff Size: Normal)   Pulse 73   Temp 97.9 F (36.6 C)  (Oral)   Resp 16   Ht 5' 2.01 (1.575 m)   Wt 135 lb (61.2 kg)   SpO2 95%   BMI 24.69 kg/m   Wt Readings from Last 3 Encounters:  04/09/24 135 lb (61.2 kg)  08/02/23 131 lb 12.8 oz (59.8 kg)  05/10/23 135 lb (61.2 kg)    Physical Exam Vitals and nursing note reviewed. Exam conducted with a chaperone present.  Constitutional:      General: She is awake. She is not in acute distress.    Appearance: She is well-developed and well-groomed. She is not ill-appearing or toxic-appearing.  HENT:     Head: Normocephalic and atraumatic.     Right Ear: Hearing, tympanic membrane, ear canal and external ear normal. No drainage.     Left Ear: Hearing, tympanic membrane, ear canal and external ear normal. No drainage.     Nose: Nose normal.     Right Sinus: No maxillary sinus tenderness or frontal sinus tenderness.     Left Sinus: No maxillary sinus tenderness or frontal sinus tenderness.     Mouth/Throat:     Mouth: Mucous membranes are moist.     Pharynx: Oropharynx is clear. Uvula midline. No pharyngeal swelling, oropharyngeal exudate or posterior oropharyngeal erythema.  Eyes:     General: Lids are normal.        Right eye: No discharge.        Left eye: No discharge.     Extraocular Movements: Extraocular movements intact.     Conjunctiva/sclera: Conjunctivae normal.     Pupils: Pupils are equal, round, and reactive to light.     Visual Fields: Right eye visual fields normal and left eye visual fields normal.  Neck:     Thyroid: No thyromegaly.     Vascular: No carotid bruit.     Trachea: Trachea normal.  Cardiovascular:     Rate and Rhythm: Normal rate and regular rhythm.     Heart sounds: Murmur heard.     Systolic murmur is present with a grade of 2/6.     No gallop.  Pulmonary:     Effort: Pulmonary effort is normal. No accessory muscle usage or respiratory distress.     Breath sounds: Normal breath sounds.  Chest:  Breasts:    Right: Normal.     Left: Normal.  Abdominal:      General: Bowel sounds are normal.     Palpations: Abdomen is soft. There is no hepatomegaly or splenomegaly.     Tenderness: There is no abdominal tenderness.  Musculoskeletal:        General: Normal range of motion.     Cervical back: Normal range of motion and neck supple.     Right lower leg: No edema.     Left lower leg: No edema.  Lymphadenopathy:     Head:  Right side of head: No submental, submandibular, tonsillar, preauricular or posterior auricular adenopathy.     Left side of head: No submental, submandibular, tonsillar, preauricular or posterior auricular adenopathy.     Cervical: No cervical adenopathy.     Upper Body:     Right upper body: No supraclavicular, axillary or pectoral adenopathy.     Left upper body: No supraclavicular, axillary or pectoral adenopathy.  Skin:    General: Skin is warm and dry.     Capillary Refill: Capillary refill takes less than 2 seconds.     Findings: No rash.  Neurological:     Mental Status: She is alert and oriented to person, place, and time.     Gait: Gait is intact.     Deep Tendon Reflexes: Reflexes are normal and symmetric.     Reflex Scores:      Brachioradialis reflexes are 2+ on the right side and 2+ on the left side.      Patellar reflexes are 2+ on the right side and 2+ on the left side. Psychiatric:        Attention and Perception: Attention normal.        Mood and Affect: Mood normal.        Speech: Speech normal.        Behavior: Behavior normal. Behavior is cooperative.        Thought Content: Thought content normal.        Judgment: Judgment normal.    Results for orders placed or performed in visit on 03/12/24  Bayer DCA Hb A1c Waived (STAT)   Collection Time: 03/12/24  8:36 AM  Result Value Ref Range   HB A1C (BAYER DCA - WAIVED) 5.8 (H) 4.8 - 5.6 %  CBC w/Diff   Collection Time: 03/12/24  8:37 AM  Result Value Ref Range   WBC 4.4 3.4 - 10.8 x10E3/uL   RBC 4.66 3.77 - 5.28 x10E6/uL   Hemoglobin 13.6  11.1 - 15.9 g/dL   Hematocrit 57.5 65.9 - 46.6 %   MCV 91 79 - 97 fL   MCH 29.2 26.6 - 33.0 pg   MCHC 32.1 31.5 - 35.7 g/dL   RDW 87.5 88.2 - 84.5 %   Platelets 175 150 - 450 x10E3/uL   Neutrophils 50 Not Estab. %   Lymphs 41 Not Estab. %   Monocytes 6 Not Estab. %   Eos 2 Not Estab. %   Basos 1 Not Estab. %   Neutrophils Absolute 2.2 1.4 - 7.0 x10E3/uL   Lymphocytes Absolute 1.8 0.7 - 3.1 x10E3/uL   Monocytes Absolute 0.3 0.1 - 0.9 x10E3/uL   EOS (ABSOLUTE) 0.1 0.0 - 0.4 x10E3/uL   Basophils Absolute 0.0 0.0 - 0.2 x10E3/uL   Immature Granulocytes 0 Not Estab. %   Immature Grans (Abs) 0.0 0.0 - 0.1 x10E3/uL  TSH   Collection Time: 03/12/24  8:37 AM  Result Value Ref Range   TSH 7.440 (H) 0.450 - 4.500 uIU/mL  T4, free   Collection Time: 03/12/24  8:37 AM  Result Value Ref Range   Free T4 1.55 0.82 - 1.77 ng/dL  Lipid Panel w/o Chol/HDL Ratio   Collection Time: 03/12/24  8:37 AM  Result Value Ref Range   Cholesterol, Total 182 100 - 199 mg/dL   Triglycerides 63 0 - 149 mg/dL   HDL 54 >60 mg/dL   VLDL Cholesterol Cal 12 5 - 40 mg/dL   LDL Chol Calc (NIH) 883 (H) 0 - 99 mg/dL  Comp Met (CMET)   Collection Time: 03/12/24  8:37 AM  Result Value Ref Range   Glucose 107 (H) 70 - 99 mg/dL   BUN 18 8 - 27 mg/dL   Creatinine, Ser 9.16 0.57 - 1.00 mg/dL   eGFR 80 >40 fO/fpw/8.26   BUN/Creatinine Ratio 22 12 - 28   Sodium 142 134 - 144 mmol/L   Potassium 4.1 3.5 - 5.2 mmol/L   Chloride 104 96 - 106 mmol/L   CO2 24 20 - 29 mmol/L   Calcium 8.8 8.7 - 10.3 mg/dL   Total Protein 6.3 6.0 - 8.5 g/dL   Albumin 4.1 3.9 - 4.9 g/dL   Globulin, Total 2.2 1.5 - 4.5 g/dL   Bilirubin Total 0.2 0.0 - 1.2 mg/dL   Alkaline Phosphatase 72 49 - 135 IU/L   AST 37 0 - 40 IU/L   ALT 37 (H) 0 - 32 IU/L      Assessment & Plan:   Problem List Items Addressed This Visit       Cardiovascular and Mediastinum   Migraine   Chronic, stable.  Continue Maxalt  PRN and if any worsening discussed  with her starting preventative, which she has never taken.  Could consider Amitriptyline or Topamax.  Would avoid BB due to lower resting HR at baseline.        Endocrine   Hypothyroidism, acquired   Chronic, stable.  Recent dose changes on 03/27/24.  Continue current dose of Levothyroxine  and adjust as needed.  Plan on thyroid labs repeat around 05/08/24.        Other   Prediabetes   Ongoing with A1c recently 5.8%.  Significant family history of diabetes.  Monitor closely.  She is heavily focused on diet and exercise.        Heart murmur - Primary   Systolic with no symptoms. Monitor closely and obtain echo as needed.      Other Visit Diagnoses       Encounter for annual physical exam             Follow up plan: Return in about 1 year (around 04/09/2025) for Annual Physical + repeat thyroid outpatient lab around 05/08/24.   LABORATORY TESTING:  - Pap smear: up to date  IMMUNIZATIONS:   - Tdap: Tetanus vaccination status reviewed: last tetanus booster within 10 years. - Influenza: refuses at this time - Pneumovax: Not applicable - Prevnar: Not applicable - COVID: Up to date - HPV: Not applicable - Shingrix vaccine: Up to date  SCREENING: -Mammogram: refuses at this time - Colonoscopy: refuses at this time - Bone Density: Not applicable  -Hearing Test: Not applicable  -Spirometry: Not applicable   PATIENT COUNSELING:   Advised to take 1 mg of folate supplement per day if capable of pregnancy.   Sexuality: Discussed sexually transmitted diseases, partner selection, use of condoms, avoidance of unintended pregnancy  and contraceptive alternatives.   Advised to avoid cigarette smoking.  I discussed with the patient that most people either abstain from alcohol or drink within safe limits (<=14/week and <=4 drinks/occasion for males, <=7/weeks and <= 3 drinks/occasion for females) and that the risk for alcohol disorders and other health effects rises proportionally with  the number of drinks per week and how often a drinker exceeds daily limits.  Discussed cessation/primary prevention of drug use and availability of treatment for abuse.   Diet: Encouraged to adjust caloric intake to maintain  or achieve ideal body weight, to reduce intake of dietary  saturated fat and total fat, to limit sodium intake by avoiding high sodium foods and not adding table salt, and to maintain adequate dietary potassium and calcium preferably from fresh fruits, vegetables, and low-fat dairy products.    Stressed the importance of regular exercise  Injury prevention: Discussed safety belts, safety helmets, smoke detector, smoking near bedding or upholstery.   Dental health: Discussed importance of regular tooth brushing, flossing, and dental visits.    NEXT PREVENTATIVE PHYSICAL DUE IN 1 YEAR. Return in about 1 year (around 04/09/2025) for Annual Physical + repeat thyroid outpatient lab around 05/08/24.           "

## 2024-05-08 ENCOUNTER — Other Ambulatory Visit: Payer: Self-pay

## 2025-04-11 ENCOUNTER — Encounter: Payer: Self-pay | Admitting: Nurse Practitioner
